# Patient Record
Sex: Female | Born: 1945 | Race: White | Hispanic: No | Marital: Married | Smoking: Never smoker
Health system: Southern US, Community
[De-identification: ages and names within clinical notes are randomized; demographics above are authoritative.]

## PROBLEM LIST (undated history)

## (undated) DIAGNOSIS — R002 Palpitations: Secondary | ICD-10-CM

## (undated) DIAGNOSIS — I82409 Acute embolism and thrombosis of unspecified deep veins of unspecified lower extremity: Secondary | ICD-10-CM

## (undated) DIAGNOSIS — I10 Essential (primary) hypertension: Secondary | ICD-10-CM

## (undated) DIAGNOSIS — D66 Hereditary factor VIII deficiency: Secondary | ICD-10-CM

## (undated) HISTORY — DX: Acute embolism and thrombosis of unspecified deep veins of unspecified lower extremity: I82.409

## (undated) HISTORY — DX: Hereditary factor VIII deficiency: D66

## (undated) HISTORY — DX: Palpitations: R00.2

## (undated) HISTORY — DX: Essential (primary) hypertension: I10

---

## 1979-02-22 HISTORY — PX: GANGLION CYST EXCISION: SHX1691

## 1998-08-09 ENCOUNTER — Other Ambulatory Visit: Admission: RE | Admit: 1998-08-09 | Discharge: 1998-08-09 | Payer: Self-pay | Admitting: Obstetrics and Gynecology

## 1999-12-02 ENCOUNTER — Other Ambulatory Visit: Admission: RE | Admit: 1999-12-02 | Discharge: 1999-12-02 | Payer: Self-pay | Admitting: Obstetrics and Gynecology

## 2001-02-08 ENCOUNTER — Other Ambulatory Visit: Admission: RE | Admit: 2001-02-08 | Discharge: 2001-02-08 | Payer: Self-pay | Admitting: Obstetrics and Gynecology

## 2002-11-02 ENCOUNTER — Other Ambulatory Visit: Admission: RE | Admit: 2002-11-02 | Discharge: 2002-11-02 | Payer: Self-pay | Admitting: Obstetrics and Gynecology

## 2004-07-11 ENCOUNTER — Other Ambulatory Visit: Admission: RE | Admit: 2004-07-11 | Discharge: 2004-07-11 | Payer: Self-pay | Admitting: Obstetrics and Gynecology

## 2004-07-15 ENCOUNTER — Encounter: Admission: RE | Admit: 2004-07-15 | Discharge: 2004-07-15 | Payer: Self-pay | Admitting: Obstetrics and Gynecology

## 2004-10-11 ENCOUNTER — Encounter: Admission: RE | Admit: 2004-10-11 | Discharge: 2004-10-11 | Payer: Self-pay | Admitting: Family Medicine

## 2005-04-03 ENCOUNTER — Encounter: Admission: RE | Admit: 2005-04-03 | Discharge: 2005-04-03 | Payer: Self-pay | Admitting: Obstetrics and Gynecology

## 2005-12-15 ENCOUNTER — Ambulatory Visit: Payer: Self-pay | Admitting: Internal Medicine

## 2005-12-15 ENCOUNTER — Observation Stay (HOSPITAL_COMMUNITY): Admission: EM | Admit: 2005-12-15 | Discharge: 2005-12-16 | Payer: Self-pay | Admitting: Emergency Medicine

## 2005-12-16 ENCOUNTER — Ambulatory Visit: Payer: Self-pay

## 2009-03-14 ENCOUNTER — Encounter: Admission: RE | Admit: 2009-03-14 | Discharge: 2009-03-14 | Payer: Self-pay | Admitting: Obstetrics and Gynecology

## 2010-04-09 ENCOUNTER — Encounter: Admission: RE | Admit: 2010-04-09 | Discharge: 2010-04-09 | Payer: Self-pay | Admitting: Obstetrics and Gynecology

## 2010-07-25 ENCOUNTER — Other Ambulatory Visit (HOSPITAL_COMMUNITY): Payer: Self-pay | Admitting: Obstetrics and Gynecology

## 2010-07-25 DIAGNOSIS — Z8041 Family history of malignant neoplasm of ovary: Secondary | ICD-10-CM

## 2010-07-25 DIAGNOSIS — N7011 Chronic salpingitis: Secondary | ICD-10-CM

## 2010-07-31 ENCOUNTER — Ambulatory Visit (HOSPITAL_COMMUNITY)
Admission: RE | Admit: 2010-07-31 | Discharge: 2010-07-31 | Disposition: A | Discharge status: Home / Self Care | Payer: 59 | Source: Ambulatory Visit | Attending: Obstetrics and Gynecology | Admitting: Obstetrics and Gynecology

## 2010-07-31 DIAGNOSIS — N7011 Chronic salpingitis: Secondary | ICD-10-CM

## 2010-07-31 DIAGNOSIS — N7013 Chronic salpingitis and oophoritis: Secondary | ICD-10-CM | POA: Insufficient documentation

## 2010-07-31 DIAGNOSIS — Z8041 Family history of malignant neoplasm of ovary: Secondary | ICD-10-CM | POA: Insufficient documentation

## 2010-07-31 MED ORDER — IOHEXOL 300 MG/ML  SOLN: 100.0000 mL | Freq: Once | INTRAMUSCULAR | Status: DC | PRN

## 2010-07-31 MED ORDER — IOHEXOL 300 MG/ML  SOLN: 100.0000 mL | Freq: Once | INTRAMUSCULAR | Status: AC | PRN

## 2010-11-08 NOTE — H&P (Signed)
NAMESHAMAINE, MULKERN NO.:  [id number]   MEDICAL RECORD NO.:  [id number]          PATIENT TYPE:  OBV   LOCATION:  1845                         FACILITY:  [facility]   PHYSICIAN:  Pricilla Riffle, M.D.    DATE OF BIRTH:  1946-02-02   DATE OF ADMISSION:  12/15/2005  DATE OF DISCHARGE:                                HISTORY & PHYSICAL   IDENTIFICATION:  I was asked to see the patient regarding chest pain.   HISTORY OF PRESENT ILLNESS:  The patient has no known history of coronary  artery disease.  She was in her usual state of health until Friday, when she  had left arm pain.  She said she had been doing some exercises more with her  left arm recently.  Friday night, she had trouble sleeping.  Saturday she  developed some burning chest pressure that radiated to her belly, associated  with some weakness, dizziness, diaphoresis.  It lasted about 30 minutes.  It  did not occur with any exertion.  Again on Sunday, intermittent arm pain on  the left.  No more chest pain.  Had some back pain.  Now complains of upper  back pain; questions if it is the cot.  No shortness of breath.  No nausea  or vomiting.   ALLERGIES:  None.   MEDICATIONS:  Multivitamin p.r.n.   PAST MEDICAL HISTORY:  1.  Gastroesophageal reflux in the past.  2.  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  On anticoagulation for a while, before discharged from clinic.   PAST SURGICAL HISTORY:  Colon ganglionic cyst.   SOCIAL HISTORY:  The patient is married.  Has worked in Air[employer].  Quit tobacco  40 years ago.  Does not drink.  Walks some.  Exercises some.   FAMILY HISTORY:  Mother died of CHF and lung cancer at age 52.  Father is  alive.  No history of CAD.  Siblings with no CAD.  Maternal uncle with CAD.   REVIEW OF SYSTEMS:  All systems reviewed.  Had some left calf pain 3 days  ago; otherwise, negative for other problems, except as noted.   PHYSICAL EXAMINATION:   GENERAL:  The patient is currently in no acute  distress.  The patient denies chest pain.  VITAL SIGNS:  Blood pressure 135/78, pulse 103 and 66, respiratory rate 21.  HEENT:  Normocephalic and atraumatic.  Pupils equal, round and reactive to  light.  Extraocular movements intact.  NECK:  JVP is normal.  No bruits.  LUNGS:  Clear to auscultation.  BACK:  Nontender.  CHEST:  Nontender.  ARMS:  No pain with movement.  CARDIAC:  Regular rate and rhythm, S1 and S2.  No S3, S4 or murmurs.  ABDOMEN:  Benign.  EXTREMITIES:  Good distal pulses.  No lower extremity edema.  NEUROLOGIC:  Alert and oriented x3.  Cranial nerves II-XII intact.  There  was 5/5 motor throughout.   CHEST X-RAY:  No acute disease.  A 12-lead electrocardiogram revealed normal  sinus rhythm at 66 beats per minute.   LABORATORY DATA:  Significant for a hemoglobin of 14.6, WBC of 3.9.  BUN and  creatinine of 11 and 0.9.  Potassium 4.7.  D-dimer negative at 0.32.   IMPRESSION:  The patient is a 65 year old woman with no known coronary  artery disease who had an episode of chest pain on Saturday, arm pain on  Friday, some Saturday and Sunday.  Notes some exercise with her arm.  The  pain is not completely typical in her chest, not exertional.  Note blood  pressure was higher today in Dr. Harlene Salts office; on arrival here 170s  systolic over 80s to 90s.  Currently pain free, except for some pain across  the back.   PLAN:  Admit, rule out myocardial infarction, check a CMET, load her with  beta blocker, aspirin, amylase, lipase.  Referral for stress Myoview.   History is unclear regarding her arterial clot, but D-dimer is negative.  Check fasting lipids.           ______________________________  Pricilla Riffle, M.D.     PVR/MEDQ  D:  12/15/2005  T:  12/15/2005  Job:  [id number]

## 2010-11-08 NOTE — Discharge Summary (Signed)
[name], [name]:  [id number]   MEDICAL RECORD NO.:  [id number]          PATIENT TYPE:  OBV   LOCATION:  6526                         FACILITY:  [facility]   PHYSICIAN:  Charlton Haws, M.D.     DATE OF BIRTH:  10/04/1945   DATE OF ADMISSION:  12/15/2005  DATE OF DISCHARGE:  12/16/2005                                 DISCHARGE SUMMARY   PRIMARY CARE PHYSICIAN:  Lianne Bushy, M.D.   PRIMARY CARDIOLOGIST:  The patient is new to [facility] cardiology and has seen  Dr. Dietrich Pates.   PRINCIPAL DIAGNOSIS:  Chest pain.   OTHER DIAGNOSES:  1.  Gastroesophageal reflux disease.  2.  History of left lower extremity clot in 1969.   ALLERGIES:  No known drug allergies.   PROCEDURES:  None.   HISTORY OF PRESENT ILLNESS:  A 65 year old white female with no prior  history of CAD, who had sudden onset of left arm pain on Friday, June 22,  and then on June 23 had substernal chest burning with radiation to the left  arm associated weakness, presyncope and diaphoresis, lasting approximately  30 minutes and was worse with exertion.  She continued have intermittent  chest pain on June 24, thus prompting her to present to [facility] ED on  June 25 with complaints of upper back pain.  Given prolonged history of  chest discomfort, she was admitted for further evaluation.   HOSPITAL COURSE:  She ruled out for MI and has not had any additional chest  discomfort.  She has been scheduled for an outpatient Myoview study at  [facility] today at 12:15 p.m.  She is being discharged home in the  hospital today in satisfactory condition.   DISCHARGE LABS:  Hemoglobin 12.7, hematocrit  37.3, WBC 4.7, platelets 233.  Sodium 140, potassium 3.8, chloride 104, CO2 28, BUN 8, creatinine 0.7,  glucose 100.  PT 14.4, INR 1.1.  Total bilirubin 0.7, alkaline phosphatase  46, AST 17, ALT 13, amylase 41, lipase 28, albumin 4.1.  Cardiac markers  negative x2.  Total cholesterol 223,  triglycerides 36, HDL 86, LDL 130.  Calcium 9.1.  D-dimer 0.32.   DISPOSITION:  The patient is being discharged home today in good condition.   FOLLOW-UP APPOINTMENTS:  She is asked to followup with her primary care  physician, Dr. Purnell Shoemaker, in 1-2 weeks.  She will have an outpatient functional  study today and provided that this is negative, she does not require any  additional cardiac evaluation.  If, however, outpatient study was abnormal,  she will be contacted by our office for additional cardiology follow-up.   DISCHARGE MEDICATIONS:  Aspirin 81 mg q.d.   OUTSTANDING LAB STUDIES:  None.   DURATION OF DISCHARGE ENCOUNTER:  35 minutes including physician.      Ok Anis, NP    ______________________________  Charlton Haws, M.D.    CRB/MEDQ  D:  12/16/2005  T:  12/16/2005  Job:  [id number]   cc:   Lianne Bushy, M.D.  Fax: [phone]

## 2012-04-01 DIAGNOSIS — R42 Dizziness and giddiness: Secondary | ICD-10-CM | POA: Insufficient documentation

## 2012-04-01 DIAGNOSIS — M542 Cervicalgia: Secondary | ICD-10-CM | POA: Insufficient documentation

## 2012-04-01 DIAGNOSIS — H538 Other visual disturbances: Secondary | ICD-10-CM | POA: Insufficient documentation

## 2012-08-12 DIAGNOSIS — M7918 Myalgia, other site: Secondary | ICD-10-CM | POA: Insufficient documentation

## 2014-07-25 DIAGNOSIS — E039 Hypothyroidism, unspecified: Secondary | ICD-10-CM | POA: Diagnosis not present

## 2014-07-25 DIAGNOSIS — E559 Vitamin D deficiency, unspecified: Secondary | ICD-10-CM | POA: Diagnosis not present

## 2014-07-25 DIAGNOSIS — E639 Nutritional deficiency, unspecified: Secondary | ICD-10-CM | POA: Diagnosis not present

## 2014-07-27 DIAGNOSIS — D2239 Melanocytic nevi of other parts of face: Secondary | ICD-10-CM | POA: Diagnosis not present

## 2014-07-27 DIAGNOSIS — L814 Other melanin hyperpigmentation: Secondary | ICD-10-CM | POA: Diagnosis not present

## 2014-08-01 DIAGNOSIS — Z1231 Encounter for screening mammogram for malignant neoplasm of breast: Secondary | ICD-10-CM | POA: Diagnosis not present

## 2014-08-01 DIAGNOSIS — Z124 Encounter for screening for malignant neoplasm of cervix: Secondary | ICD-10-CM | POA: Diagnosis not present

## 2014-08-01 DIAGNOSIS — Z6827 Body mass index (BMI) 27.0-27.9, adult: Secondary | ICD-10-CM | POA: Diagnosis not present

## 2014-09-08 DIAGNOSIS — J209 Acute bronchitis, unspecified: Secondary | ICD-10-CM | POA: Diagnosis not present

## 2014-12-08 DIAGNOSIS — E039 Hypothyroidism, unspecified: Secondary | ICD-10-CM | POA: Diagnosis not present

## 2014-12-08 DIAGNOSIS — M549 Dorsalgia, unspecified: Secondary | ICD-10-CM | POA: Diagnosis not present

## 2014-12-08 DIAGNOSIS — E038 Other specified hypothyroidism: Secondary | ICD-10-CM | POA: Diagnosis not present

## 2014-12-08 DIAGNOSIS — I10 Essential (primary) hypertension: Secondary | ICD-10-CM | POA: Diagnosis not present

## 2014-12-08 DIAGNOSIS — M546 Pain in thoracic spine: Secondary | ICD-10-CM | POA: Diagnosis not present

## 2015-03-20 DIAGNOSIS — I10 Essential (primary) hypertension: Secondary | ICD-10-CM | POA: Diagnosis not present

## 2015-03-25 DIAGNOSIS — I10 Essential (primary) hypertension: Secondary | ICD-10-CM | POA: Diagnosis not present

## 2015-04-03 DIAGNOSIS — R42 Dizziness and giddiness: Secondary | ICD-10-CM | POA: Diagnosis not present

## 2015-04-03 DIAGNOSIS — I10 Essential (primary) hypertension: Secondary | ICD-10-CM | POA: Diagnosis not present

## 2015-04-03 DIAGNOSIS — E663 Overweight: Secondary | ICD-10-CM | POA: Diagnosis not present

## 2015-04-03 DIAGNOSIS — Z79899 Other long term (current) drug therapy: Secondary | ICD-10-CM | POA: Diagnosis not present

## 2015-04-03 DIAGNOSIS — E039 Hypothyroidism, unspecified: Secondary | ICD-10-CM | POA: Diagnosis not present

## 2015-05-01 DIAGNOSIS — M542 Cervicalgia: Secondary | ICD-10-CM | POA: Diagnosis not present

## 2015-05-01 DIAGNOSIS — H81392 Other peripheral vertigo, left ear: Secondary | ICD-10-CM | POA: Diagnosis not present

## 2015-05-01 DIAGNOSIS — M799 Soft tissue disorder, unspecified: Secondary | ICD-10-CM | POA: Diagnosis not present

## 2015-05-01 DIAGNOSIS — R42 Dizziness and giddiness: Secondary | ICD-10-CM | POA: Diagnosis not present

## 2015-05-02 DIAGNOSIS — M542 Cervicalgia: Secondary | ICD-10-CM | POA: Diagnosis not present

## 2015-05-02 DIAGNOSIS — H81392 Other peripheral vertigo, left ear: Secondary | ICD-10-CM | POA: Diagnosis not present

## 2015-05-02 DIAGNOSIS — R42 Dizziness and giddiness: Secondary | ICD-10-CM | POA: Diagnosis not present

## 2015-05-02 DIAGNOSIS — M799 Soft tissue disorder, unspecified: Secondary | ICD-10-CM | POA: Diagnosis not present

## 2015-05-03 DIAGNOSIS — Z9181 History of falling: Secondary | ICD-10-CM | POA: Diagnosis not present

## 2015-05-03 DIAGNOSIS — I10 Essential (primary) hypertension: Secondary | ICD-10-CM | POA: Diagnosis not present

## 2015-05-08 DIAGNOSIS — R42 Dizziness and giddiness: Secondary | ICD-10-CM | POA: Diagnosis not present

## 2015-05-08 DIAGNOSIS — M542 Cervicalgia: Secondary | ICD-10-CM | POA: Diagnosis not present

## 2015-05-08 DIAGNOSIS — M799 Soft tissue disorder, unspecified: Secondary | ICD-10-CM | POA: Diagnosis not present

## 2015-05-08 DIAGNOSIS — H81392 Other peripheral vertigo, left ear: Secondary | ICD-10-CM | POA: Diagnosis not present

## 2015-05-09 DIAGNOSIS — H81392 Other peripheral vertigo, left ear: Secondary | ICD-10-CM | POA: Diagnosis not present

## 2015-05-09 DIAGNOSIS — R42 Dizziness and giddiness: Secondary | ICD-10-CM | POA: Diagnosis not present

## 2015-05-09 DIAGNOSIS — M542 Cervicalgia: Secondary | ICD-10-CM | POA: Diagnosis not present

## 2015-05-09 DIAGNOSIS — M799 Soft tissue disorder, unspecified: Secondary | ICD-10-CM | POA: Diagnosis not present

## 2015-05-10 DIAGNOSIS — H81392 Other peripheral vertigo, left ear: Secondary | ICD-10-CM | POA: Diagnosis not present

## 2015-05-10 DIAGNOSIS — R42 Dizziness and giddiness: Secondary | ICD-10-CM | POA: Diagnosis not present

## 2015-05-10 DIAGNOSIS — M799 Soft tissue disorder, unspecified: Secondary | ICD-10-CM | POA: Diagnosis not present

## 2015-05-10 DIAGNOSIS — M542 Cervicalgia: Secondary | ICD-10-CM | POA: Diagnosis not present

## 2015-05-13 DIAGNOSIS — H81392 Other peripheral vertigo, left ear: Secondary | ICD-10-CM | POA: Diagnosis not present

## 2015-05-13 DIAGNOSIS — M799 Soft tissue disorder, unspecified: Secondary | ICD-10-CM | POA: Diagnosis not present

## 2015-05-13 DIAGNOSIS — R42 Dizziness and giddiness: Secondary | ICD-10-CM | POA: Diagnosis not present

## 2015-05-13 DIAGNOSIS — M542 Cervicalgia: Secondary | ICD-10-CM | POA: Diagnosis not present

## 2015-05-22 DIAGNOSIS — M799 Soft tissue disorder, unspecified: Secondary | ICD-10-CM | POA: Diagnosis not present

## 2015-05-22 DIAGNOSIS — M542 Cervicalgia: Secondary | ICD-10-CM | POA: Diagnosis not present

## 2015-05-22 DIAGNOSIS — H81392 Other peripheral vertigo, left ear: Secondary | ICD-10-CM | POA: Diagnosis not present

## 2015-05-22 DIAGNOSIS — R42 Dizziness and giddiness: Secondary | ICD-10-CM | POA: Diagnosis not present

## 2015-05-23 DIAGNOSIS — R42 Dizziness and giddiness: Secondary | ICD-10-CM | POA: Diagnosis not present

## 2015-05-23 DIAGNOSIS — M542 Cervicalgia: Secondary | ICD-10-CM | POA: Diagnosis not present

## 2015-05-23 DIAGNOSIS — H81392 Other peripheral vertigo, left ear: Secondary | ICD-10-CM | POA: Diagnosis not present

## 2015-05-23 DIAGNOSIS — M799 Soft tissue disorder, unspecified: Secondary | ICD-10-CM | POA: Diagnosis not present

## 2015-05-24 DIAGNOSIS — M542 Cervicalgia: Secondary | ICD-10-CM | POA: Diagnosis not present

## 2015-05-24 DIAGNOSIS — M799 Soft tissue disorder, unspecified: Secondary | ICD-10-CM | POA: Diagnosis not present

## 2015-05-24 DIAGNOSIS — R42 Dizziness and giddiness: Secondary | ICD-10-CM | POA: Diagnosis not present

## 2015-05-24 DIAGNOSIS — H81392 Other peripheral vertigo, left ear: Secondary | ICD-10-CM | POA: Diagnosis not present

## 2015-05-28 DIAGNOSIS — M542 Cervicalgia: Secondary | ICD-10-CM | POA: Diagnosis not present

## 2015-05-28 DIAGNOSIS — R42 Dizziness and giddiness: Secondary | ICD-10-CM | POA: Diagnosis not present

## 2015-05-28 DIAGNOSIS — M799 Soft tissue disorder, unspecified: Secondary | ICD-10-CM | POA: Diagnosis not present

## 2015-05-28 DIAGNOSIS — H81392 Other peripheral vertigo, left ear: Secondary | ICD-10-CM | POA: Diagnosis not present

## 2015-05-29 ENCOUNTER — Encounter: Payer: Self-pay | Admitting: Interventional Cardiology

## 2015-05-29 ENCOUNTER — Ambulatory Visit (INDEPENDENT_AMBULATORY_CARE_PROVIDER_SITE_OTHER): Payer: Medicare Other | Admitting: Interventional Cardiology

## 2015-05-29 VITALS — BP 140/78 | HR 84 | Ht 64.0 in | Wt 153.0 lb

## 2015-05-29 DIAGNOSIS — I10 Essential (primary) hypertension: Secondary | ICD-10-CM | POA: Diagnosis not present

## 2015-05-29 DIAGNOSIS — D6859 Other primary thrombophilia: Secondary | ICD-10-CM | POA: Diagnosis not present

## 2015-05-29 DIAGNOSIS — R011 Cardiac murmur, unspecified: Secondary | ICD-10-CM | POA: Diagnosis not present

## 2015-05-29 DIAGNOSIS — M542 Cervicalgia: Secondary | ICD-10-CM | POA: Diagnosis not present

## 2015-05-29 DIAGNOSIS — M799 Soft tissue disorder, unspecified: Secondary | ICD-10-CM | POA: Diagnosis not present

## 2015-05-29 DIAGNOSIS — R42 Dizziness and giddiness: Secondary | ICD-10-CM | POA: Diagnosis not present

## 2015-05-29 DIAGNOSIS — H81392 Other peripheral vertigo, left ear: Secondary | ICD-10-CM | POA: Diagnosis not present

## 2015-05-29 DIAGNOSIS — D6852 Prothrombin gene mutation: Secondary | ICD-10-CM

## 2015-05-29 NOTE — Progress Notes (Signed)
Patient ID: Jamie King, female   DOB: [birth year], 69 y.o.   MRN: [id number]     Cardiology Office Note   Date:  05/29/2015   ID:  Jamie King, DOB 1946-01-09, MRN [id number]  PCP:  [facility]    No chief complaint on file. murmur   Wt Readings from Last 3 Encounters:  05/29/15 153 lb (69.4 kg)       History of Present Illness: Courtland Mellin is a 69 y.o. female  Who has had some issues with DVT x 2; once after childbirth and after a broken neck.  She did not do well with anticoagulation so takes only aspirin.  Irt was recommended tohat she stay on anticoagulation.  She did not tolerate Xarelto.    She has had BP spikes, several times a year.  BPs to the [id number] systolic.  She gets dizzy and flushed.  She had some palpitations.  Since OCtober, BP has been controlled.  She is more used to her meds at this point.  She was getting some PT for a prior broken neck causing stiffness., PMD heard a murmur so she was sent here.  No CP with exertion.  SHe does do regular walking.  She does this for 30 minutes 3x/week.  She feels well with this activity.    Mother had CHF.    She was concerned about an abnormal ECG reading.   She brought labwork revealing that she is heterozygous for prothrombin gene mutation.     Past Medical History  Diagnosis Date  . Hypertension   . Palpitations   . DVT (deep venous thrombosis) ([place])   . 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 Ganglion cyst excision  1980'S     Current Outpatient Prescriptions  Medication Sig Dispense Refill  . acidophilus (RISAQUAD) CAPS capsule Take 1 capsule by mouth daily. PROBIOTIC    . aspirin 81 MG tablet Take 81 mg by mouth daily.    . Betaine HCl 300 MG TABS Take 1 tablet by mouth daily.    . Iodine Strong, Lugols, (LUGOLS PO) Take 50 mg by mouth daily.    Marland Kitchen levothyroxine (SYNTHROID, LEVOTHROID) 75 MCG tablet Take 75 mcg by mouth daily before  breakfast.    . liothyronine (CYTOMEL) 5 MCG tablet Take 5 mcg by mouth daily.    Marland Kitchen lisinopril (PRINIVIL,ZESTRIL) 20 MG tablet Take 20 mg by mouth daily.    . magnesium gluconate (MAGONATE) 30 MG tablet Take 30 mg by mouth 2 (two) times daily.    . Omega-3 Fatty Acids (FISH OIL PO) Take by mouth daily.    Marland Kitchen OVER THE COUNTER MEDICATION Take 1 capsule by mouth daily. Serretia    . OVER THE COUNTER MEDICATION Take 1 capsule by mouth daily. DioVasc    . Pantothenic Acid 250 MG CAPS Take 1 capsule by mouth daily.     No current facility-administered medications for this visit.    Allergies:   Codeine and Oxycodone    Social History:  The patient  reports that she has never smoked. She does not have any smokeless tobacco history on file. She reports that she drinks about 0.6 - 1.2 oz of alcohol per week.   Family History:  The patient's family history includes Cancer in her mother; Heart attack in her mother; Heart disease in her mother; Stroke in her father. There is no history of Hypertension.    ROS:  Please see the history of present illness.   Otherwise, review of systems are positive for rare BP spikes.   All other systems are reviewed and negative.    PHYSICAL EXAM: VS:  BP 140/78 mmHg  Pulse 84  Ht 5\' 4"  (1.626 m)  Wt 153 lb (69.4 kg)  BMI 26.25 kg/m2  SpO2 97% , BMI Body mass index is 26.25 kg/(m^2). GEN: Well nourished, well developed, in no acute distress HEENT: normal Neck: no JVD, carotid bruits, or masses Cardiac: RRR; early, soft murmurs, no rubs, or gallops,no edema  Respiratory:  clear to auscultation bilaterally, normal work of breathing GI: soft, nontender, nondistended, + BS MS: no deformity or atrophy Skin: warm and dry, no rash Neuro:  Strength and sensation are intact Psych: euthymic mood, full affect     Recent Labs: No results found for requested labs within last 365 days.   Lipid Panel No results found for: CHOL, TRIG, HDL, CHOLHDL, VLDL, LDLCALC,  LDLDIRECT   Other studies Reviewed: Additional studies/ records that were reviewed today with results demonstrating: prior ECGs showing PRWP.   ASSESSMENT AND PLAN:  1. Heart murmur: Soft, early systolic murmur. Sounds benign. She has no cardiac symptoms and an otherwise normal cardiac exam. Would not plan for any further cardiac testing. She does develop any symptoms of chest discomfort or shortness of breath, could consider echocardiogram or further treadmill testing. If she develops palpitations, could consider rhythm monitoring. Of note, she had a stress test in 2007 which apparently was negative per her report. 2. Hypertension: Fairly well controlled at this time. I did instruct her that if she has a blood pressure spike, she can take an extra 20 mg of lisinopril. 3. Hypothyroid: Her blood pressure may be affected by her thyroid function. She has had some difficulty with her thyroid medicines recently. No further cardiac evaluation needed before her endocrine evaluation. 4. Poor R wave progression noted on ECG. No clinical signs of low EF. This may be a normal variant for her as it appeared on several serial ECGs.  Would not  Pursue imaging unless she had symptoms.   Current medicines are reviewed at length with the patient today.  The patient concerns regarding her medicines were addressed.  The following changes have been made:  No change  Labs/ tests ordered today include:  No orders of the defined types were placed in this encounter.    Recommend 150 minutes/week of aerobic exercise Low fat, low carb, high fiber diet recommended  Disposition:   FU prn   Teresita Madura., MD  05/29/2015 12:11 PM    [facility] [place], [place], [place]  [address] Phone: [phone]; Fax: [phone]

## 2015-05-29 NOTE — Patient Instructions (Signed)
**Note De-identified Janyla Biscoe Obfuscation** Medication Instructions:  Same-no changes  Labwork: None  Testing/Procedures: None  Follow-Up: As needed     If you need a refill on your cardiac medications before your next appointment, please call your pharmacy.   

## 2015-05-30 DIAGNOSIS — M542 Cervicalgia: Secondary | ICD-10-CM | POA: Diagnosis not present

## 2015-05-30 DIAGNOSIS — M799 Soft tissue disorder, unspecified: Secondary | ICD-10-CM | POA: Diagnosis not present

## 2015-05-30 DIAGNOSIS — H81392 Other peripheral vertigo, left ear: Secondary | ICD-10-CM | POA: Diagnosis not present

## 2015-05-30 DIAGNOSIS — R42 Dizziness and giddiness: Secondary | ICD-10-CM | POA: Diagnosis not present

## 2015-06-05 DIAGNOSIS — H81392 Other peripheral vertigo, left ear: Secondary | ICD-10-CM | POA: Diagnosis not present

## 2015-06-05 DIAGNOSIS — R42 Dizziness and giddiness: Secondary | ICD-10-CM | POA: Diagnosis not present

## 2015-06-05 DIAGNOSIS — M799 Soft tissue disorder, unspecified: Secondary | ICD-10-CM | POA: Diagnosis not present

## 2015-06-05 DIAGNOSIS — M542 Cervicalgia: Secondary | ICD-10-CM | POA: Diagnosis not present

## 2015-06-06 DIAGNOSIS — M542 Cervicalgia: Secondary | ICD-10-CM | POA: Diagnosis not present

## 2015-06-06 DIAGNOSIS — H81392 Other peripheral vertigo, left ear: Secondary | ICD-10-CM | POA: Diagnosis not present

## 2015-06-06 DIAGNOSIS — M799 Soft tissue disorder, unspecified: Secondary | ICD-10-CM | POA: Diagnosis not present

## 2015-06-06 DIAGNOSIS — R42 Dizziness and giddiness: Secondary | ICD-10-CM | POA: Diagnosis not present

## 2015-06-11 DIAGNOSIS — M542 Cervicalgia: Secondary | ICD-10-CM | POA: Diagnosis not present

## 2015-06-11 DIAGNOSIS — M799 Soft tissue disorder, unspecified: Secondary | ICD-10-CM | POA: Diagnosis not present

## 2015-06-11 DIAGNOSIS — H81392 Other peripheral vertigo, left ear: Secondary | ICD-10-CM | POA: Diagnosis not present

## 2015-06-11 DIAGNOSIS — R42 Dizziness and giddiness: Secondary | ICD-10-CM | POA: Diagnosis not present

## 2015-06-12 DIAGNOSIS — M542 Cervicalgia: Secondary | ICD-10-CM | POA: Diagnosis not present

## 2015-06-12 DIAGNOSIS — M799 Soft tissue disorder, unspecified: Secondary | ICD-10-CM | POA: Diagnosis not present

## 2015-06-12 DIAGNOSIS — H81392 Other peripheral vertigo, left ear: Secondary | ICD-10-CM | POA: Diagnosis not present

## 2015-06-12 DIAGNOSIS — R42 Dizziness and giddiness: Secondary | ICD-10-CM | POA: Diagnosis not present

## 2015-06-19 DIAGNOSIS — H81392 Other peripheral vertigo, left ear: Secondary | ICD-10-CM | POA: Diagnosis not present

## 2015-06-19 DIAGNOSIS — M542 Cervicalgia: Secondary | ICD-10-CM | POA: Diagnosis not present

## 2015-06-19 DIAGNOSIS — R42 Dizziness and giddiness: Secondary | ICD-10-CM | POA: Diagnosis not present

## 2015-06-19 DIAGNOSIS — M799 Soft tissue disorder, unspecified: Secondary | ICD-10-CM | POA: Diagnosis not present

## 2015-06-21 DIAGNOSIS — H81392 Other peripheral vertigo, left ear: Secondary | ICD-10-CM | POA: Diagnosis not present

## 2015-06-21 DIAGNOSIS — R42 Dizziness and giddiness: Secondary | ICD-10-CM | POA: Diagnosis not present

## 2015-06-21 DIAGNOSIS — M542 Cervicalgia: Secondary | ICD-10-CM | POA: Diagnosis not present

## 2015-06-21 DIAGNOSIS — M799 Soft tissue disorder, unspecified: Secondary | ICD-10-CM | POA: Diagnosis not present

## 2015-06-26 DIAGNOSIS — M799 Soft tissue disorder, unspecified: Secondary | ICD-10-CM | POA: Diagnosis not present

## 2015-06-26 DIAGNOSIS — H81392 Other peripheral vertigo, left ear: Secondary | ICD-10-CM | POA: Diagnosis not present

## 2015-06-26 DIAGNOSIS — R42 Dizziness and giddiness: Secondary | ICD-10-CM | POA: Diagnosis not present

## 2015-06-26 DIAGNOSIS — M542 Cervicalgia: Secondary | ICD-10-CM | POA: Diagnosis not present

## 2015-06-28 DIAGNOSIS — R42 Dizziness and giddiness: Secondary | ICD-10-CM | POA: Diagnosis not present

## 2015-06-28 DIAGNOSIS — H81392 Other peripheral vertigo, left ear: Secondary | ICD-10-CM | POA: Diagnosis not present

## 2015-06-28 DIAGNOSIS — M799 Soft tissue disorder, unspecified: Secondary | ICD-10-CM | POA: Diagnosis not present

## 2015-06-28 DIAGNOSIS — M542 Cervicalgia: Secondary | ICD-10-CM | POA: Diagnosis not present

## 2015-07-04 DIAGNOSIS — R42 Dizziness and giddiness: Secondary | ICD-10-CM | POA: Diagnosis not present

## 2015-07-04 DIAGNOSIS — M542 Cervicalgia: Secondary | ICD-10-CM | POA: Diagnosis not present

## 2015-07-04 DIAGNOSIS — H81392 Other peripheral vertigo, left ear: Secondary | ICD-10-CM | POA: Diagnosis not present

## 2015-07-04 DIAGNOSIS — M799 Soft tissue disorder, unspecified: Secondary | ICD-10-CM | POA: Diagnosis not present

## 2015-07-05 DIAGNOSIS — M799 Soft tissue disorder, unspecified: Secondary | ICD-10-CM | POA: Diagnosis not present

## 2015-07-05 DIAGNOSIS — M542 Cervicalgia: Secondary | ICD-10-CM | POA: Diagnosis not present

## 2015-07-05 DIAGNOSIS — R42 Dizziness and giddiness: Secondary | ICD-10-CM | POA: Diagnosis not present

## 2015-07-05 DIAGNOSIS — H81392 Other peripheral vertigo, left ear: Secondary | ICD-10-CM | POA: Diagnosis not present

## 2015-07-10 DIAGNOSIS — R42 Dizziness and giddiness: Secondary | ICD-10-CM | POA: Diagnosis not present

## 2015-07-10 DIAGNOSIS — M799 Soft tissue disorder, unspecified: Secondary | ICD-10-CM | POA: Diagnosis not present

## 2015-07-10 DIAGNOSIS — M542 Cervicalgia: Secondary | ICD-10-CM | POA: Diagnosis not present

## 2015-07-10 DIAGNOSIS — H81392 Other peripheral vertigo, left ear: Secondary | ICD-10-CM | POA: Diagnosis not present

## 2015-07-12 DIAGNOSIS — M799 Soft tissue disorder, unspecified: Secondary | ICD-10-CM | POA: Diagnosis not present

## 2015-07-12 DIAGNOSIS — R42 Dizziness and giddiness: Secondary | ICD-10-CM | POA: Diagnosis not present

## 2015-07-12 DIAGNOSIS — H81392 Other peripheral vertigo, left ear: Secondary | ICD-10-CM | POA: Diagnosis not present

## 2015-07-12 DIAGNOSIS — M542 Cervicalgia: Secondary | ICD-10-CM | POA: Diagnosis not present

## 2015-07-17 DIAGNOSIS — M799 Soft tissue disorder, unspecified: Secondary | ICD-10-CM | POA: Diagnosis not present

## 2015-07-17 DIAGNOSIS — M542 Cervicalgia: Secondary | ICD-10-CM | POA: Diagnosis not present

## 2015-07-17 DIAGNOSIS — H81392 Other peripheral vertigo, left ear: Secondary | ICD-10-CM | POA: Diagnosis not present

## 2015-07-17 DIAGNOSIS — R42 Dizziness and giddiness: Secondary | ICD-10-CM | POA: Diagnosis not present

## 2015-07-19 DIAGNOSIS — M799 Soft tissue disorder, unspecified: Secondary | ICD-10-CM | POA: Diagnosis not present

## 2015-07-19 DIAGNOSIS — R42 Dizziness and giddiness: Secondary | ICD-10-CM | POA: Diagnosis not present

## 2015-07-19 DIAGNOSIS — M542 Cervicalgia: Secondary | ICD-10-CM | POA: Diagnosis not present

## 2015-07-19 DIAGNOSIS — H81392 Other peripheral vertigo, left ear: Secondary | ICD-10-CM | POA: Diagnosis not present

## 2015-07-24 DIAGNOSIS — M542 Cervicalgia: Secondary | ICD-10-CM | POA: Diagnosis not present

## 2015-07-24 DIAGNOSIS — M799 Soft tissue disorder, unspecified: Secondary | ICD-10-CM | POA: Diagnosis not present

## 2015-07-24 DIAGNOSIS — R42 Dizziness and giddiness: Secondary | ICD-10-CM | POA: Diagnosis not present

## 2015-07-24 DIAGNOSIS — H81392 Other peripheral vertigo, left ear: Secondary | ICD-10-CM | POA: Diagnosis not present

## 2015-07-26 DIAGNOSIS — M799 Soft tissue disorder, unspecified: Secondary | ICD-10-CM | POA: Diagnosis not present

## 2015-07-26 DIAGNOSIS — M542 Cervicalgia: Secondary | ICD-10-CM | POA: Diagnosis not present

## 2015-07-26 DIAGNOSIS — R42 Dizziness and giddiness: Secondary | ICD-10-CM | POA: Diagnosis not present

## 2015-07-26 DIAGNOSIS — H81392 Other peripheral vertigo, left ear: Secondary | ICD-10-CM | POA: Diagnosis not present

## 2015-07-31 DIAGNOSIS — L821 Other seborrheic keratosis: Secondary | ICD-10-CM | POA: Diagnosis not present

## 2015-07-31 DIAGNOSIS — D225 Melanocytic nevi of trunk: Secondary | ICD-10-CM | POA: Diagnosis not present

## 2015-08-02 DIAGNOSIS — M542 Cervicalgia: Secondary | ICD-10-CM | POA: Diagnosis not present

## 2015-08-02 DIAGNOSIS — E039 Hypothyroidism, unspecified: Secondary | ICD-10-CM | POA: Diagnosis not present

## 2015-08-02 DIAGNOSIS — H81392 Other peripheral vertigo, left ear: Secondary | ICD-10-CM | POA: Diagnosis not present

## 2015-08-02 DIAGNOSIS — M799 Soft tissue disorder, unspecified: Secondary | ICD-10-CM | POA: Diagnosis not present

## 2015-08-02 DIAGNOSIS — R42 Dizziness and giddiness: Secondary | ICD-10-CM | POA: Diagnosis not present

## 2015-08-02 DIAGNOSIS — I10 Essential (primary) hypertension: Secondary | ICD-10-CM | POA: Diagnosis not present

## 2015-08-03 DIAGNOSIS — R42 Dizziness and giddiness: Secondary | ICD-10-CM | POA: Diagnosis not present

## 2015-08-03 DIAGNOSIS — H81392 Other peripheral vertigo, left ear: Secondary | ICD-10-CM | POA: Diagnosis not present

## 2015-08-03 DIAGNOSIS — M799 Soft tissue disorder, unspecified: Secondary | ICD-10-CM | POA: Diagnosis not present

## 2015-08-03 DIAGNOSIS — M542 Cervicalgia: Secondary | ICD-10-CM | POA: Diagnosis not present

## 2015-08-16 DIAGNOSIS — M8588 Other specified disorders of bone density and structure, other site: Secondary | ICD-10-CM | POA: Diagnosis not present

## 2015-08-16 DIAGNOSIS — Z1231 Encounter for screening mammogram for malignant neoplasm of breast: Secondary | ICD-10-CM | POA: Diagnosis not present

## 2015-08-16 DIAGNOSIS — N958 Other specified menopausal and perimenopausal disorders: Secondary | ICD-10-CM | POA: Diagnosis not present

## 2015-09-04 DIAGNOSIS — E039 Hypothyroidism, unspecified: Secondary | ICD-10-CM | POA: Diagnosis not present

## 2015-09-11 DIAGNOSIS — R739 Hyperglycemia, unspecified: Secondary | ICD-10-CM | POA: Diagnosis not present

## 2015-09-11 DIAGNOSIS — E509 Vitamin A deficiency, unspecified: Secondary | ICD-10-CM | POA: Diagnosis not present

## 2015-09-11 DIAGNOSIS — E279 Disorder of adrenal gland, unspecified: Secondary | ICD-10-CM | POA: Diagnosis not present

## 2015-09-11 DIAGNOSIS — E612 Magnesium deficiency: Secondary | ICD-10-CM | POA: Diagnosis not present

## 2015-09-11 DIAGNOSIS — E559 Vitamin D deficiency, unspecified: Secondary | ICD-10-CM | POA: Diagnosis not present

## 2015-09-27 DIAGNOSIS — M799 Soft tissue disorder, unspecified: Secondary | ICD-10-CM | POA: Diagnosis not present

## 2015-09-27 DIAGNOSIS — I10 Essential (primary) hypertension: Secondary | ICD-10-CM | POA: Diagnosis not present

## 2015-09-27 DIAGNOSIS — S12041S Nondisplaced lateral mass fracture of first cervical vertebra, sequela: Secondary | ICD-10-CM | POA: Diagnosis not present

## 2015-09-27 DIAGNOSIS — M542 Cervicalgia: Secondary | ICD-10-CM | POA: Diagnosis not present

## 2015-09-27 DIAGNOSIS — R293 Abnormal posture: Secondary | ICD-10-CM | POA: Diagnosis not present

## 2015-10-05 DIAGNOSIS — H1033 Unspecified acute conjunctivitis, bilateral: Secondary | ICD-10-CM | POA: Diagnosis not present

## 2015-10-10 DIAGNOSIS — M799 Soft tissue disorder, unspecified: Secondary | ICD-10-CM | POA: Diagnosis not present

## 2015-10-10 DIAGNOSIS — R293 Abnormal posture: Secondary | ICD-10-CM | POA: Diagnosis not present

## 2015-10-10 DIAGNOSIS — M542 Cervicalgia: Secondary | ICD-10-CM | POA: Diagnosis not present

## 2015-10-10 DIAGNOSIS — S12041S Nondisplaced lateral mass fracture of first cervical vertebra, sequela: Secondary | ICD-10-CM | POA: Diagnosis not present

## 2015-10-12 DIAGNOSIS — S12041S Nondisplaced lateral mass fracture of first cervical vertebra, sequela: Secondary | ICD-10-CM | POA: Diagnosis not present

## 2015-10-12 DIAGNOSIS — M799 Soft tissue disorder, unspecified: Secondary | ICD-10-CM | POA: Diagnosis not present

## 2015-10-12 DIAGNOSIS — M542 Cervicalgia: Secondary | ICD-10-CM | POA: Diagnosis not present

## 2015-10-12 DIAGNOSIS — R293 Abnormal posture: Secondary | ICD-10-CM | POA: Diagnosis not present

## 2015-10-22 DIAGNOSIS — S12041S Nondisplaced lateral mass fracture of first cervical vertebra, sequela: Secondary | ICD-10-CM | POA: Diagnosis not present

## 2015-10-22 DIAGNOSIS — M799 Soft tissue disorder, unspecified: Secondary | ICD-10-CM | POA: Diagnosis not present

## 2015-10-22 DIAGNOSIS — R293 Abnormal posture: Secondary | ICD-10-CM | POA: Diagnosis not present

## 2015-10-22 DIAGNOSIS — M542 Cervicalgia: Secondary | ICD-10-CM | POA: Diagnosis not present

## 2015-10-25 DIAGNOSIS — M799 Soft tissue disorder, unspecified: Secondary | ICD-10-CM | POA: Diagnosis not present

## 2015-10-25 DIAGNOSIS — M542 Cervicalgia: Secondary | ICD-10-CM | POA: Diagnosis not present

## 2015-10-25 DIAGNOSIS — R293 Abnormal posture: Secondary | ICD-10-CM | POA: Diagnosis not present

## 2015-10-25 DIAGNOSIS — S12041S Nondisplaced lateral mass fracture of first cervical vertebra, sequela: Secondary | ICD-10-CM | POA: Diagnosis not present

## 2015-10-31 DIAGNOSIS — M542 Cervicalgia: Secondary | ICD-10-CM | POA: Diagnosis not present

## 2015-10-31 DIAGNOSIS — M799 Soft tissue disorder, unspecified: Secondary | ICD-10-CM | POA: Diagnosis not present

## 2015-10-31 DIAGNOSIS — S12041S Nondisplaced lateral mass fracture of first cervical vertebra, sequela: Secondary | ICD-10-CM | POA: Diagnosis not present

## 2015-10-31 DIAGNOSIS — R293 Abnormal posture: Secondary | ICD-10-CM | POA: Diagnosis not present

## 2015-11-02 DIAGNOSIS — M799 Soft tissue disorder, unspecified: Secondary | ICD-10-CM | POA: Diagnosis not present

## 2015-11-02 DIAGNOSIS — S12041S Nondisplaced lateral mass fracture of first cervical vertebra, sequela: Secondary | ICD-10-CM | POA: Diagnosis not present

## 2015-11-02 DIAGNOSIS — M542 Cervicalgia: Secondary | ICD-10-CM | POA: Diagnosis not present

## 2015-11-02 DIAGNOSIS — R293 Abnormal posture: Secondary | ICD-10-CM | POA: Diagnosis not present

## 2016-01-01 DIAGNOSIS — I10 Essential (primary) hypertension: Secondary | ICD-10-CM | POA: Diagnosis not present

## 2016-07-03 DIAGNOSIS — E039 Hypothyroidism, unspecified: Secondary | ICD-10-CM | POA: Diagnosis not present

## 2016-07-03 DIAGNOSIS — I10 Essential (primary) hypertension: Secondary | ICD-10-CM | POA: Diagnosis not present

## 2016-07-31 DIAGNOSIS — D1801 Hemangioma of skin and subcutaneous tissue: Secondary | ICD-10-CM | POA: Diagnosis not present

## 2016-07-31 DIAGNOSIS — L814 Other melanin hyperpigmentation: Secondary | ICD-10-CM | POA: Diagnosis not present

## 2016-07-31 DIAGNOSIS — L821 Other seborrheic keratosis: Secondary | ICD-10-CM | POA: Diagnosis not present

## 2016-09-10 DIAGNOSIS — E039 Hypothyroidism, unspecified: Secondary | ICD-10-CM | POA: Diagnosis not present

## 2016-09-10 DIAGNOSIS — I10 Essential (primary) hypertension: Secondary | ICD-10-CM | POA: Diagnosis not present

## 2016-09-10 DIAGNOSIS — Z79899 Other long term (current) drug therapy: Secondary | ICD-10-CM | POA: Diagnosis not present

## 2016-09-10 DIAGNOSIS — R739 Hyperglycemia, unspecified: Secondary | ICD-10-CM | POA: Diagnosis not present

## 2016-09-10 DIAGNOSIS — Z0000 Encounter for general adult medical examination without abnormal findings: Secondary | ICD-10-CM | POA: Diagnosis not present

## 2016-09-10 DIAGNOSIS — E663 Overweight: Secondary | ICD-10-CM | POA: Diagnosis not present

## 2016-10-10 DIAGNOSIS — Z1231 Encounter for screening mammogram for malignant neoplasm of breast: Secondary | ICD-10-CM | POA: Diagnosis not present

## 2016-10-15 DIAGNOSIS — Z124 Encounter for screening for malignant neoplasm of cervix: Secondary | ICD-10-CM | POA: Diagnosis not present

## 2016-10-15 DIAGNOSIS — Z6828 Body mass index (BMI) 28.0-28.9, adult: Secondary | ICD-10-CM | POA: Diagnosis not present

## 2017-01-01 DIAGNOSIS — E039 Hypothyroidism, unspecified: Secondary | ICD-10-CM | POA: Diagnosis not present

## 2017-01-01 DIAGNOSIS — I10 Essential (primary) hypertension: Secondary | ICD-10-CM | POA: Diagnosis not present

## 2017-02-05 DIAGNOSIS — L814 Other melanin hyperpigmentation: Secondary | ICD-10-CM | POA: Diagnosis not present

## 2017-02-05 DIAGNOSIS — C44519 Basal cell carcinoma of skin of other part of trunk: Secondary | ICD-10-CM | POA: Diagnosis not present

## 2017-02-05 DIAGNOSIS — L820 Inflamed seborrheic keratosis: Secondary | ICD-10-CM | POA: Diagnosis not present

## 2017-03-19 DIAGNOSIS — C44529 Squamous cell carcinoma of skin of other part of trunk: Secondary | ICD-10-CM | POA: Diagnosis not present

## 2017-04-14 DIAGNOSIS — C44529 Squamous cell carcinoma of skin of other part of trunk: Secondary | ICD-10-CM | POA: Diagnosis not present

## 2017-05-07 DIAGNOSIS — C44529 Squamous cell carcinoma of skin of other part of trunk: Secondary | ICD-10-CM | POA: Diagnosis not present

## 2017-06-17 DIAGNOSIS — M549 Dorsalgia, unspecified: Secondary | ICD-10-CM | POA: Diagnosis not present

## 2017-06-17 DIAGNOSIS — I10 Essential (primary) hypertension: Secondary | ICD-10-CM | POA: Diagnosis not present

## 2017-06-17 DIAGNOSIS — Z7982 Long term (current) use of aspirin: Secondary | ICD-10-CM | POA: Diagnosis not present

## 2017-06-17 DIAGNOSIS — R0781 Pleurodynia: Secondary | ICD-10-CM | POA: Diagnosis not present

## 2017-06-17 DIAGNOSIS — R42 Dizziness and giddiness: Secondary | ICD-10-CM | POA: Diagnosis not present

## 2017-06-17 DIAGNOSIS — R079 Chest pain, unspecified: Secondary | ICD-10-CM | POA: Diagnosis not present

## 2017-06-17 DIAGNOSIS — Z79899 Other long term (current) drug therapy: Secondary | ICD-10-CM | POA: Diagnosis not present

## 2017-06-17 DIAGNOSIS — I440 Atrioventricular block, first degree: Secondary | ICD-10-CM | POA: Diagnosis not present

## 2017-06-17 DIAGNOSIS — R51 Headache: Secondary | ICD-10-CM | POA: Diagnosis not present

## 2017-06-19 DIAGNOSIS — I10 Essential (primary) hypertension: Secondary | ICD-10-CM | POA: Diagnosis not present

## 2017-07-07 DIAGNOSIS — E039 Hypothyroidism, unspecified: Secondary | ICD-10-CM | POA: Diagnosis not present

## 2017-07-07 DIAGNOSIS — I10 Essential (primary) hypertension: Secondary | ICD-10-CM | POA: Diagnosis not present

## 2017-10-07 DIAGNOSIS — J9859 Other diseases of mediastinum, not elsewhere classified: Secondary | ICD-10-CM | POA: Diagnosis not present

## 2017-10-07 DIAGNOSIS — E663 Overweight: Secondary | ICD-10-CM | POA: Diagnosis not present

## 2017-10-07 DIAGNOSIS — I10 Essential (primary) hypertension: Secondary | ICD-10-CM | POA: Diagnosis not present

## 2017-10-07 DIAGNOSIS — Z0000 Encounter for general adult medical examination without abnormal findings: Secondary | ICD-10-CM | POA: Diagnosis not present

## 2017-10-07 DIAGNOSIS — Z6826 Body mass index (BMI) 26.0-26.9, adult: Secondary | ICD-10-CM | POA: Diagnosis not present

## 2017-10-07 DIAGNOSIS — E039 Hypothyroidism, unspecified: Secondary | ICD-10-CM | POA: Diagnosis not present

## 2017-10-27 ENCOUNTER — Encounter: Payer: Medicare Other | Admitting: Cardiothoracic Surgery

## 2017-10-29 ENCOUNTER — Other Ambulatory Visit: Payer: Self-pay

## 2017-10-29 ENCOUNTER — Institutional Professional Consult (permissible substitution): Payer: PPO | Admitting: Cardiothoracic Surgery

## 2017-10-29 ENCOUNTER — Other Ambulatory Visit: Payer: Self-pay | Admitting: *Deleted

## 2017-10-29 VITALS — HR 92 | Resp 18 | Ht 64.0 in | Wt 153.0 lb

## 2017-10-29 DIAGNOSIS — J9859 Other diseases of mediastinum, not elsewhere classified: Secondary | ICD-10-CM | POA: Diagnosis not present

## 2017-10-29 NOTE — Progress Notes (Signed)
PCP is [facility] [place] Referring Provider is Street, [place], *  Chief Complaint  Patient presents with  . Mediastinal Mass    Surgical eval, CTA Chest 10/14/17   Patient examined, images of recent CT scan of chest personally reviewed and displayed and discussed with patient  HPI: 72 year old nondiabetic non-smoker with recent CTA of chest for right-sided pleuritic chest pain to evaluate potential pulmonary embolus.  Patient has history of hypercoagulability with prothrombin mutation[heterozygous] and history of DVT following childbirth and cervical spine trauma.  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  Mediastinal adenopathy benign.  No pleural effusion or pulmonary parenchymal disease.  Thoracic aorta without significant calcification.  The posterior mediastinal mass has density of fat tissue.  Patient has no previous CT scans of the chest with which to compare.  No symptoms of swallowing difficulty, blood production, hemoptysis, weight loss, night sweats, or previous thoracic trauma.  Patient has significant hypertension and this is been labile in the past requiring careful monitoring and titration of antihypertensive medication.  She has not been assessed for pheochromocytoma.  2 years ago the patient had a syncopal episode after severe nausea and vomiting probably related to food poisoning and had a fracture of C1 vertebral body which was treated with neck collar and with full recovery.  She did have an episode of DVT related to that trauma.  She was treated with Xarelto but was intolerant to because of feeling weak and diffuse myalgia.  She currently takes aspirin.  Patient is never had general anesthesia or major surgery.  She has a nosebleed approximately once every 3 months always from the right side of the nose.  Patient has had  fairly significant hypothyroidism but this was diagnosed and has been well controlled for the past 2 years. Past Medical History:  Diagnosis Date  . DVT (deep venous thrombosis) ([place])   . Factor VIII (functional) deficiency ([place])   . Hypertension   . Palpitations     Past Surgical History:  Procedure Laterality Date  . GANGLION CYST EXCISION  1980'S    Family History  Problem Relation Age of Onset  . Heart disease Mother   . Heart attack Mother   . Cancer Mother        LUNG  . Stroke Father   . Hypertension Neg Hx     Social History Social History   Tobacco Use  . Smoking status: Never Smoker  . Smokeless tobacco: Never Used  Substance Use Topics  . Alcohol use: Yes    Alcohol/week: 0.6 - 1.2 oz    Types: 1 - 2 Glasses of wine per week  . Drug use: Not on file    Current Outpatient Medications  Medication Sig Dispense Refill  . aspirin 81 MG tablet Take 81 mg by mouth daily.    Marland Kitchen b complex vitamins tablet Take 1 tablet by mouth daily.    . Betaine HCl 300 MG TABS Take 1 tablet by mouth daily.    . cholecalciferol (VITAMIN D) 400 units TABS tablet Take 400 Units by mouth.    . Cholecalciferol (VITAMIN D3 PO) Take 2 tablets by mouth.    . Iodine Strong, Lugols, (LUGOLS PO) Take 50 mg by mouth daily.    Marland Kitchen levothyroxine (SYNTHROID, LEVOTHROID) 50 MCG tablet   1  . liothyronine (CYTOMEL) 5 MCG tablet  Take 10 mcg by mouth daily.     Marland Kitchen lisinopril (PRINIVIL,ZESTRIL) 20 MG tablet Take 20 mg by mouth daily.    . Magnesium Bisglycinate (MAG GLYCINATE) 100 MG TABS Take 400 mg by mouth.    . Omega-3 Fatty Acids (FISH OIL PO) Take by mouth daily.    . Pantothenic Acid 250 MG CAPS Take 1 capsule by mouth daily.    . Turmeric 500 MG CAPS Take 1 tablet by mouth.     No current facility-administered medications for this visit.     Allergies  Allergen Reactions  . Codeine Nausea And Vomiting and Other (See Comments)  . Oxycodone Other (See Comments)    Review of Systems   Weight stable No active dental complaints No change in vision or headache No shortness of breath No angina.  Patient was evaluated by Dr. Irish Lack 2 years ago felt the patient was in good health and did not recommend an echocardiogram or stress test. No history of jaundice or hepatitis or ulcer disease Positive history of spider venous varicosities of both legs and DVT of the left femoral vein No history of stroke or seizure  Pulse 92   Resp 18   Ht 5\' 4"  (1.626 m)   Wt 153 lb (69.4 kg)   SpO2 98% Comment: RA  BMI 26.26 kg/m  Physical Exam      Exam    General- alert and comfortable    Neck- no JVD, no cervical adenopathy palpable, no carotid bruit   Lungs- clear without rales, wheezes   Cor- regular rate and rhythm, no murmur , gallop   Abdomen- soft, non-tender   Extremities - warm, non-tender, minimal edema, positive spider varicosities   Neuro- oriented, appropriate, no focal weakness   Diagnostic Tests: 4 cm ovoid mass right posterior mediastinum above the diaphragm anterior to the vertebral body without  proximity to the descending aorta.  Density is consistent with fat tissue.  I have not seen a lipoma in this area previously and there is concern this could represent a liposarcoma.  Could also represent a tumor of neural origin.  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  If there any chance this could be malignant the patient will need resection via right VATS.  If there is only trace activity on PET scan a ultrasound-guided endoscopic biopsy through the esophagus could probably aspirate some cellular material.If   the patient needs surgery she will need PFTs, brain scan to complete clinical staging, and assessment of possible pheochromocytoma with abdominal CT and metanephrine assay  Plan: Return after PET scan and review situation   Len Childs, MD [facility] [phone]

## 2017-11-11 ENCOUNTER — Ambulatory Visit (HOSPITAL_COMMUNITY)
Admission: RE | Admit: 2017-11-11 | Discharge: 2017-11-11 | Disposition: A | Discharge status: Home / Self Care | Payer: PPO | Source: Ambulatory Visit | Attending: Cardiothoracic Surgery | Admitting: Cardiothoracic Surgery

## 2017-11-11 ENCOUNTER — Encounter: Payer: PPO | Admitting: Cardiothoracic Surgery

## 2017-11-11 DIAGNOSIS — R222 Localized swelling, mass and lump, trunk: Secondary | ICD-10-CM | POA: Diagnosis not present

## 2017-11-11 DIAGNOSIS — J9859 Other diseases of mediastinum, not elsewhere classified: Secondary | ICD-10-CM | POA: Insufficient documentation

## 2017-11-11 DIAGNOSIS — N83202 Unspecified ovarian cyst, left side: Secondary | ICD-10-CM | POA: Diagnosis not present

## 2017-11-11 DIAGNOSIS — I700 Atherosclerosis of aorta: Secondary | ICD-10-CM | POA: Insufficient documentation

## 2017-11-11 DIAGNOSIS — R933 Abnormal findings on diagnostic imaging of other parts of digestive tract: Secondary | ICD-10-CM | POA: Insufficient documentation

## 2017-11-11 DIAGNOSIS — N83201 Unspecified ovarian cyst, right side: Secondary | ICD-10-CM | POA: Diagnosis not present

## 2017-11-11 DIAGNOSIS — K449 Diaphragmatic hernia without obstruction or gangrene: Secondary | ICD-10-CM | POA: Insufficient documentation

## 2017-11-11 DIAGNOSIS — K5730 Diverticulosis of large intestine without perforation or abscess without bleeding: Secondary | ICD-10-CM | POA: Diagnosis not present

## 2017-11-11 LAB — GLUCOSE, CAPILLARY: Glucose-Capillary: 102 mg/dL — ABNORMAL HIGH (ref 65–99)

## 2017-11-11 MED ORDER — FLUDEOXYGLUCOSE F - 18 (FDG) INJECTION
7.5800 | Freq: Once | INTRAVENOUS | Status: AC | PRN
  Administered 2017-11-11: 7.58 via INTRAVENOUS

## 2017-11-12 ENCOUNTER — Encounter: Payer: Self-pay | Admitting: Cardiothoracic Surgery

## 2017-11-12 ENCOUNTER — Ambulatory Visit (INDEPENDENT_AMBULATORY_CARE_PROVIDER_SITE_OTHER): Payer: PPO | Admitting: Cardiothoracic Surgery

## 2017-11-12 VITALS — BP 170/94 | HR 92 | Resp 20 | Ht 64.0 in | Wt 151.0 lb

## 2017-11-12 DIAGNOSIS — J9859 Other diseases of mediastinum, not elsewhere classified: Secondary | ICD-10-CM

## 2017-11-12 NOTE — Progress Notes (Signed)
PCP is [facility] [place] Referring Provider is Street, [place] Mt, *  Chief Complaint  Patient presents with  . Mediastinal Mass    Discuss PET Scan 11/11/17    HPI: The patient returns with PET scan for further assessment and discussion of a recently diagnosed posterior mediastinal mass with fat density.  PET scan was performed to assess malignant potential.  The CT scan-PET scan images demonstrate that the fat density is part of a hiatal hernia with abdominal fat extending through the hiatus into the lower chest.  There is no hypermetabolic activity of this region nor is there any other suspicious hypermetabolic activity.  I discussed the results of the PET scan with the patient including treating the symptoms of hiatal hernia and avoidance of eating a meal and then lying supine. There is some evidence of inflammation of her GE junction related to the probable GERD however no mass-effect or evidence of tumor. Past Medical History:  Diagnosis Date  . DVT (deep venous thrombosis) ([place])   . Factor VIII (functional) deficiency ([place])   . Hypertension   . Palpitations     Past Surgical History:  Procedure Laterality Date  . GANGLION CYST EXCISION  1980'S    Family History  Problem Relation Age of Onset  . Heart disease Mother   . Heart attack Mother   . Cancer Mother        LUNG  . Stroke Father   . Hypertension Neg Hx     Social History Social History   Tobacco Use  . Smoking status: Never Smoker  . Smokeless tobacco: Never Used  Substance Use Topics  . Alcohol use: Yes    Alcohol/week: 0.6 - 1.2 oz    Types: 1 - 2 Glasses of wine per week  . Drug use: Not on file    Current Outpatient Medications  Medication Sig Dispense Refill  . aspirin 81 MG tablet Take 81 mg by mouth daily.    Marland Kitchen b complex vitamins tablet Take 1 tablet by mouth daily.    . Betaine HCl 300 MG TABS Take 1 tablet by mouth daily.    . cholecalciferol (VITAMIN D) 400 units TABS tablet  Take 400 Units by mouth.    . Cholecalciferol (VITAMIN D3 PO) Take 2 tablets by mouth.    . Iodine Strong, Lugols, (LUGOLS PO) Take 50 mg by mouth daily.    Marland Kitchen levothyroxine (SYNTHROID, LEVOTHROID) 50 MCG tablet   1  . liothyronine (CYTOMEL) 5 MCG tablet Take 10 mcg by mouth daily.     Marland Kitchen lisinopril (PRINIVIL,ZESTRIL) 20 MG tablet Take 20 mg by mouth daily.    . Magnesium Bisglycinate (MAG GLYCINATE) 100 MG TABS Take 400 mg by mouth.    . Omega-3 Fatty Acids (FISH OIL PO) Take by mouth daily.    . Pantothenic Acid 250 MG CAPS Take 1 capsule by mouth daily.    . Turmeric 500 MG CAPS Take 1 tablet by mouth.     No current facility-administered medications for this visit.     Allergies  Allergen Reactions  . Codeine Nausea And Vomiting and Other (See Comments)  . Oxycodone Other (See Comments)    Review of Systems  No changes since previous exam for consultation  BP (!) 170/94   Pulse 92   Resp 20   Ht 5\' 4"  (1.626 m)   Wt 151 lb (68.5 kg)   SpO2 97% Comment: RA  BMI 25.92 kg/m  Physical Exam  Exam    General- alert and comfortable    Neck- no JVD, no cervical adenopathy palpable, no carotid bruit   Lungs- clear without rales, wheezes   Cor- regular rate and rhythm, no murmur , gallop   Abdomen- soft, non-tender   Extremities - warm, non-tender, minimal edema   Neuro- oriented, appropriate, no focal weakness    Diagnostic Tests: PET scan shows the mediastinal fat density to be benign abdominal fat related to a sliding hiatal hernia  Impression: No further testing or biopsy needed  Plan: Patient will follow-up with her primary physician.  She is recommended to have the primary physician view of the results of the PET scan and recommendations by radiology.  Len Childs, MD [facility] [phone]

## 2018-01-05 DIAGNOSIS — I10 Essential (primary) hypertension: Secondary | ICD-10-CM | POA: Diagnosis not present

## 2018-01-05 DIAGNOSIS — E039 Hypothyroidism, unspecified: Secondary | ICD-10-CM | POA: Diagnosis not present

## 2018-02-15 DIAGNOSIS — Z1231 Encounter for screening mammogram for malignant neoplasm of breast: Secondary | ICD-10-CM | POA: Diagnosis not present

## 2018-02-15 DIAGNOSIS — Z01419 Encounter for gynecological examination (general) (routine) without abnormal findings: Secondary | ICD-10-CM | POA: Diagnosis not present

## 2018-02-15 DIAGNOSIS — Z6825 Body mass index (BMI) 25.0-25.9, adult: Secondary | ICD-10-CM | POA: Diagnosis not present

## 2018-03-02 DIAGNOSIS — N7011 Chronic salpingitis: Secondary | ICD-10-CM | POA: Diagnosis not present

## 2018-03-02 DIAGNOSIS — Z8041 Family history of malignant neoplasm of ovary: Secondary | ICD-10-CM | POA: Diagnosis not present

## 2018-07-08 DIAGNOSIS — E039 Hypothyroidism, unspecified: Secondary | ICD-10-CM | POA: Diagnosis not present

## 2018-07-08 DIAGNOSIS — I10 Essential (primary) hypertension: Secondary | ICD-10-CM | POA: Diagnosis not present

## 2018-08-09 DIAGNOSIS — L578 Other skin changes due to chronic exposure to nonionizing radiation: Secondary | ICD-10-CM | POA: Diagnosis not present

## 2018-08-09 DIAGNOSIS — D225 Melanocytic nevi of trunk: Secondary | ICD-10-CM | POA: Diagnosis not present

## 2018-08-09 DIAGNOSIS — L821 Other seborrheic keratosis: Secondary | ICD-10-CM | POA: Diagnosis not present

## 2018-08-09 DIAGNOSIS — D485 Neoplasm of uncertain behavior of skin: Secondary | ICD-10-CM | POA: Diagnosis not present

## 2018-08-09 DIAGNOSIS — C44519 Basal cell carcinoma of skin of other part of trunk: Secondary | ICD-10-CM | POA: Diagnosis not present

## 2018-08-18 IMAGING — PT NM PET TUM IMG INITIAL (PI) SKULL BASE T - THIGH
1 of 8 series · 1 of 25 positions shown · non-contrast
Comparison: 06/17/2017 chest CT angiogram.

CLINICAL DATA: Initial treatment strategy for incidental
mediastinal mass seen on chest CT angiogram performed for right
chest pain.

EXAM:
NUCLEAR MEDICINE PET SKULL BASE TO THIGH
TECHNIQUE: 7.6 mCi F-18 FDG was injected intravenously. Full-ring PET imaging
was performed from the skull base to thigh after the radiotracer. CT
data was obtained and used for attenuation correction and anatomic
localization.
Fasting blood glucose: 102 mg/dl

[Series 5: ct sk_thigh 5.0 b31f · axial · 5.0mm · 0.91mm/px · 1 of 204 slices shown]
[im 204/204  brain]
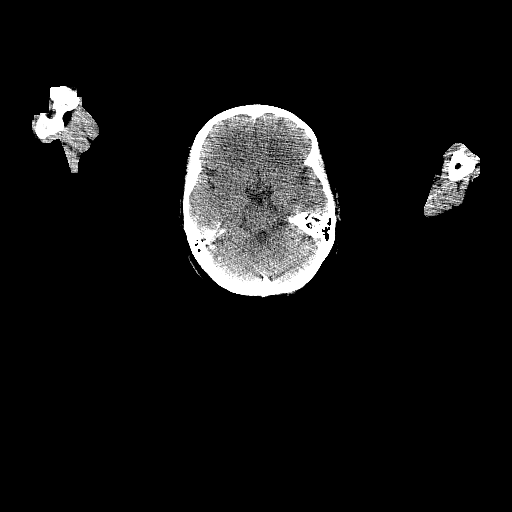

[1 of 25 positions shown; findings below may reference images not displayed]

FINDINGS: Mediastinal blood pool activity: SUV max

NECK: No hypermetabolic lymph nodes in the neck. Uniform
hypermetabolism in the normal size thyroid gland without discrete
thyroid nodules on the noncontrast CT images.

Incidental CT findings: none

CHEST:

No mediastinal mass. There is a stable small hiatal hernia with
stable adjacent herniated upper abdominal fat to the right of the
intrathoracic portion of the stomach, which accounts for the
previously described fat density finding on the 06/17/2017 chest CT
angiogram study. No hypermetabolism within this region of herniated
fat.

There is mild focal hypermetabolism within the lower thoracic
esophagus at the esophagogastric junction, without definite wall
thickening or discrete mass correlate on the CT images.

No hypermetabolic axillary, mediastinal or hilar lymph nodes. No
pulmonary hypermetabolism.

Incidental CT findings: No acute consolidative airspace disease,
lung masses or significant pulmonary nodules.

ABDOMEN/PELVIS: No abnormal hypermetabolic activity within the
liver, pancreas, adrenal glands, or spleen. No hypermetabolic lymph
nodes in the abdomen or pelvis.

Incidental CT findings: Small to moderate periampullary duodenal
diverticulum. Atherosclerotic nonaneurysmal abdominal aorta.
Haziness of the central mesenteric fat without associated mass,
hypermetabolism, adenopathy or fluid collection. Moderate diffuse
colonic diverticulosis. Minimally complex bilateral adnexal cysts
measuring 3.5 cm on the right (series 5/image 158) and 3.2 cm on the
left (series 5/image 160) with thin internal septations and no
associated hypermetabolism.

SKELETON: No focal hypermetabolic activity to suggest skeletal
metastasis.

Incidental CT findings: none
IMPRESSION: 1. No mediastinal mass. Stable small hiatal hernia with stable
adjacent herniated upper abdominal fat to the right of the
intrathoracic portion of the stomach, which accounts for the
previously described fat density finding on the 06/17/2017 chest CT
angiogram study. No hypermetabolism within this region of herniated
fat.
2. Mild nonspecific focal hypermetabolism within the lower thoracic
esophagus at the esophagogastric junction, without definite wall
thickening or discrete mass correlate on the noncontrast CT images.
Upper endoscopic correlation may be obtained as clinically
warranted.
3. Uniform hypermetabolism within the normal size thyroid gland
without discrete thyroid nodules on the noncontrast CT images.
Recommend correlation with serum thyroid function tests.
4. Misty mesentery without associated adenopathy, mass, fluid
collection or hypermetabolism. This nonspecific finding likely
represents mesenteric panniculitis. Consider attention on a
follow-up CT abdomen/pelvis with oral and IV contrast in 12 months.
5. Minimally complex bilateral adnexal cysts with thin internal
septations and no associated hypermetabolism, probably benign.
Recommend correlation with transabdominal and transvaginal pelvic
ultrasound.
6. Moderate diffuse colonic diverticulosis.
7.  Aortic Atherosclerosis ([id number]-L92.2).

## 2018-09-01 DIAGNOSIS — C44519 Basal cell carcinoma of skin of other part of trunk: Secondary | ICD-10-CM | POA: Diagnosis not present

## 2018-10-14 DIAGNOSIS — Z6827 Body mass index (BMI) 27.0-27.9, adult: Secondary | ICD-10-CM | POA: Diagnosis not present

## 2018-10-14 DIAGNOSIS — D6859 Other primary thrombophilia: Secondary | ICD-10-CM | POA: Diagnosis not present

## 2018-10-14 DIAGNOSIS — E039 Hypothyroidism, unspecified: Secondary | ICD-10-CM | POA: Diagnosis not present

## 2018-10-14 DIAGNOSIS — Z0000 Encounter for general adult medical examination without abnormal findings: Secondary | ICD-10-CM | POA: Diagnosis not present

## 2018-10-14 DIAGNOSIS — E663 Overweight: Secondary | ICD-10-CM | POA: Diagnosis not present

## 2018-10-14 DIAGNOSIS — D68318 Other hemorrhagic disorder due to intrinsic circulating anticoagulants, antibodies, or inhibitors: Secondary | ICD-10-CM | POA: Diagnosis not present

## 2018-10-14 DIAGNOSIS — K449 Diaphragmatic hernia without obstruction or gangrene: Secondary | ICD-10-CM | POA: Diagnosis not present

## 2018-10-14 DIAGNOSIS — I10 Essential (primary) hypertension: Secondary | ICD-10-CM | POA: Diagnosis not present

## 2018-12-13 DIAGNOSIS — L821 Other seborrheic keratosis: Secondary | ICD-10-CM | POA: Diagnosis not present

## 2019-01-06 DIAGNOSIS — E039 Hypothyroidism, unspecified: Secondary | ICD-10-CM | POA: Diagnosis not present

## 2019-01-06 DIAGNOSIS — I10 Essential (primary) hypertension: Secondary | ICD-10-CM | POA: Diagnosis not present

## 2019-01-19 ENCOUNTER — Other Ambulatory Visit: Payer: Self-pay

## 2019-04-13 DIAGNOSIS — R5383 Other fatigue: Secondary | ICD-10-CM | POA: Diagnosis not present

## 2019-04-13 DIAGNOSIS — Z13220 Encounter for screening for lipoid disorders: Secondary | ICD-10-CM | POA: Diagnosis not present

## 2019-04-13 DIAGNOSIS — Z6827 Body mass index (BMI) 27.0-27.9, adult: Secondary | ICD-10-CM | POA: Diagnosis not present

## 2019-04-13 DIAGNOSIS — Z79899 Other long term (current) drug therapy: Secondary | ICD-10-CM | POA: Diagnosis not present

## 2019-04-13 DIAGNOSIS — L0390 Cellulitis, unspecified: Secondary | ICD-10-CM | POA: Diagnosis not present

## 2019-04-13 DIAGNOSIS — I10 Essential (primary) hypertension: Secondary | ICD-10-CM | POA: Diagnosis not present

## 2019-04-25 DIAGNOSIS — E538 Deficiency of other specified B group vitamins: Secondary | ICD-10-CM | POA: Diagnosis not present

## 2019-05-02 DIAGNOSIS — E538 Deficiency of other specified B group vitamins: Secondary | ICD-10-CM | POA: Diagnosis not present

## 2019-05-09 DIAGNOSIS — E538 Deficiency of other specified B group vitamins: Secondary | ICD-10-CM | POA: Diagnosis not present

## 2019-05-17 DIAGNOSIS — D519 Vitamin B12 deficiency anemia, unspecified: Secondary | ICD-10-CM | POA: Diagnosis not present

## 2019-05-18 ENCOUNTER — Other Ambulatory Visit: Payer: Self-pay

## 2019-06-07 DIAGNOSIS — D519 Vitamin B12 deficiency anemia, unspecified: Secondary | ICD-10-CM | POA: Diagnosis not present

## 2019-07-08 DIAGNOSIS — D519 Vitamin B12 deficiency anemia, unspecified: Secondary | ICD-10-CM | POA: Diagnosis not present

## 2019-07-12 DIAGNOSIS — I10 Essential (primary) hypertension: Secondary | ICD-10-CM | POA: Diagnosis not present

## 2019-07-12 DIAGNOSIS — E039 Hypothyroidism, unspecified: Secondary | ICD-10-CM | POA: Diagnosis not present

## 2019-07-18 DIAGNOSIS — Z6828 Body mass index (BMI) 28.0-28.9, adult: Secondary | ICD-10-CM | POA: Diagnosis not present

## 2019-07-18 DIAGNOSIS — Z1231 Encounter for screening mammogram for malignant neoplasm of breast: Secondary | ICD-10-CM | POA: Diagnosis not present

## 2019-07-18 DIAGNOSIS — Z01419 Encounter for gynecological examination (general) (routine) without abnormal findings: Secondary | ICD-10-CM | POA: Diagnosis not present

## 2019-08-10 DIAGNOSIS — D519 Vitamin B12 deficiency anemia, unspecified: Secondary | ICD-10-CM | POA: Diagnosis not present

## 2019-08-24 DIAGNOSIS — D485 Neoplasm of uncertain behavior of skin: Secondary | ICD-10-CM | POA: Diagnosis not present

## 2019-08-24 DIAGNOSIS — L578 Other skin changes due to chronic exposure to nonionizing radiation: Secondary | ICD-10-CM | POA: Diagnosis not present

## 2019-08-24 DIAGNOSIS — D1801 Hemangioma of skin and subcutaneous tissue: Secondary | ICD-10-CM | POA: Diagnosis not present

## 2019-08-24 DIAGNOSIS — D225 Melanocytic nevi of trunk: Secondary | ICD-10-CM | POA: Diagnosis not present

## 2019-08-24 DIAGNOSIS — L821 Other seborrheic keratosis: Secondary | ICD-10-CM | POA: Diagnosis not present

## 2019-09-07 DIAGNOSIS — E538 Deficiency of other specified B group vitamins: Secondary | ICD-10-CM | POA: Diagnosis not present

## 2019-10-11 DIAGNOSIS — E538 Deficiency of other specified B group vitamins: Secondary | ICD-10-CM | POA: Diagnosis not present

## 2019-11-15 DIAGNOSIS — E538 Deficiency of other specified B group vitamins: Secondary | ICD-10-CM | POA: Diagnosis not present

## 2019-11-23 DIAGNOSIS — E538 Deficiency of other specified B group vitamins: Secondary | ICD-10-CM | POA: Diagnosis not present

## 2019-11-23 DIAGNOSIS — Z13220 Encounter for screening for lipoid disorders: Secondary | ICD-10-CM | POA: Diagnosis not present

## 2019-11-23 DIAGNOSIS — Z0000 Encounter for general adult medical examination without abnormal findings: Secondary | ICD-10-CM | POA: Diagnosis not present

## 2019-11-25 DIAGNOSIS — D68318 Other hemorrhagic disorder due to intrinsic circulating anticoagulants, antibodies, or inhibitors: Secondary | ICD-10-CM | POA: Diagnosis not present

## 2019-11-25 DIAGNOSIS — Z0000 Encounter for general adult medical examination without abnormal findings: Secondary | ICD-10-CM | POA: Diagnosis not present

## 2019-11-25 DIAGNOSIS — D6859 Other primary thrombophilia: Secondary | ICD-10-CM | POA: Diagnosis not present

## 2019-11-25 DIAGNOSIS — Z6827 Body mass index (BMI) 27.0-27.9, adult: Secondary | ICD-10-CM | POA: Diagnosis not present

## 2019-11-25 DIAGNOSIS — Z1331 Encounter for screening for depression: Secondary | ICD-10-CM | POA: Diagnosis not present

## 2019-11-25 DIAGNOSIS — E039 Hypothyroidism, unspecified: Secondary | ICD-10-CM | POA: Diagnosis not present

## 2019-11-25 DIAGNOSIS — I10 Essential (primary) hypertension: Secondary | ICD-10-CM | POA: Diagnosis not present

## 2019-11-25 DIAGNOSIS — E663 Overweight: Secondary | ICD-10-CM | POA: Diagnosis not present

## 2019-12-07 DIAGNOSIS — E538 Deficiency of other specified B group vitamins: Secondary | ICD-10-CM | POA: Diagnosis not present

## 2019-12-09 DIAGNOSIS — K5792 Diverticulitis of intestine, part unspecified, without perforation or abscess without bleeding: Secondary | ICD-10-CM | POA: Diagnosis not present

## 2019-12-09 DIAGNOSIS — Z6827 Body mass index (BMI) 27.0-27.9, adult: Secondary | ICD-10-CM | POA: Diagnosis not present

## 2020-01-09 DIAGNOSIS — D519 Vitamin B12 deficiency anemia, unspecified: Secondary | ICD-10-CM | POA: Diagnosis not present

## 2020-01-10 DIAGNOSIS — E039 Hypothyroidism, unspecified: Secondary | ICD-10-CM | POA: Diagnosis not present

## 2020-01-24 DIAGNOSIS — Z1211 Encounter for screening for malignant neoplasm of colon: Secondary | ICD-10-CM | POA: Diagnosis not present

## 2020-01-24 DIAGNOSIS — Z01818 Encounter for other preprocedural examination: Secondary | ICD-10-CM | POA: Diagnosis not present

## 2020-01-24 DIAGNOSIS — Z9229 Personal history of other drug therapy: Secondary | ICD-10-CM | POA: Diagnosis not present

## 2020-02-14 DIAGNOSIS — D519 Vitamin B12 deficiency anemia, unspecified: Secondary | ICD-10-CM | POA: Diagnosis not present

## 2020-02-28 DIAGNOSIS — H18413 Arcus senilis, bilateral: Secondary | ICD-10-CM | POA: Diagnosis not present

## 2020-02-28 DIAGNOSIS — H25013 Cortical age-related cataract, bilateral: Secondary | ICD-10-CM | POA: Diagnosis not present

## 2020-02-28 DIAGNOSIS — H2513 Age-related nuclear cataract, bilateral: Secondary | ICD-10-CM | POA: Diagnosis not present

## 2020-02-28 DIAGNOSIS — H25043 Posterior subcapsular polar age-related cataract, bilateral: Secondary | ICD-10-CM | POA: Diagnosis not present

## 2020-02-28 DIAGNOSIS — H2511 Age-related nuclear cataract, right eye: Secondary | ICD-10-CM | POA: Diagnosis not present

## 2020-03-13 DIAGNOSIS — E538 Deficiency of other specified B group vitamins: Secondary | ICD-10-CM | POA: Diagnosis not present

## 2020-03-21 DIAGNOSIS — Z79899 Other long term (current) drug therapy: Secondary | ICD-10-CM | POA: Diagnosis not present

## 2020-03-21 DIAGNOSIS — Z01818 Encounter for other preprocedural examination: Secondary | ICD-10-CM | POA: Diagnosis not present

## 2020-03-21 DIAGNOSIS — Z1211 Encounter for screening for malignant neoplasm of colon: Secondary | ICD-10-CM | POA: Diagnosis not present

## 2020-03-23 DIAGNOSIS — H2512 Age-related nuclear cataract, left eye: Secondary | ICD-10-CM | POA: Diagnosis not present

## 2020-03-23 DIAGNOSIS — H2511 Age-related nuclear cataract, right eye: Secondary | ICD-10-CM | POA: Diagnosis not present

## 2020-04-04 DIAGNOSIS — I10 Essential (primary) hypertension: Secondary | ICD-10-CM | POA: Diagnosis not present

## 2020-04-04 DIAGNOSIS — Z2821 Immunization not carried out because of patient refusal: Secondary | ICD-10-CM | POA: Diagnosis not present

## 2020-04-04 DIAGNOSIS — D6859 Other primary thrombophilia: Secondary | ICD-10-CM | POA: Diagnosis not present

## 2020-04-04 DIAGNOSIS — E663 Overweight: Secondary | ICD-10-CM | POA: Diagnosis not present

## 2020-04-04 DIAGNOSIS — I160 Hypertensive urgency: Secondary | ICD-10-CM | POA: Diagnosis not present

## 2020-04-04 DIAGNOSIS — Z86718 Personal history of other venous thrombosis and embolism: Secondary | ICD-10-CM | POA: Diagnosis not present

## 2020-04-04 DIAGNOSIS — D68318 Other hemorrhagic disorder due to intrinsic circulating anticoagulants, antibodies, or inhibitors: Secondary | ICD-10-CM | POA: Diagnosis not present

## 2020-04-04 DIAGNOSIS — R0989 Other specified symptoms and signs involving the circulatory and respiratory systems: Secondary | ICD-10-CM | POA: Diagnosis not present

## 2020-04-04 DIAGNOSIS — Z6826 Body mass index (BMI) 26.0-26.9, adult: Secondary | ICD-10-CM | POA: Diagnosis not present

## 2020-04-05 DIAGNOSIS — I10 Essential (primary) hypertension: Secondary | ICD-10-CM | POA: Diagnosis not present

## 2020-04-09 DIAGNOSIS — H2512 Age-related nuclear cataract, left eye: Secondary | ICD-10-CM | POA: Diagnosis not present

## 2020-04-12 DIAGNOSIS — R0989 Other specified symptoms and signs involving the circulatory and respiratory systems: Secondary | ICD-10-CM | POA: Diagnosis not present

## 2020-04-12 DIAGNOSIS — Z6827 Body mass index (BMI) 27.0-27.9, adult: Secondary | ICD-10-CM | POA: Diagnosis not present

## 2020-04-12 DIAGNOSIS — E663 Overweight: Secondary | ICD-10-CM | POA: Diagnosis not present

## 2020-04-12 DIAGNOSIS — I10 Essential (primary) hypertension: Secondary | ICD-10-CM | POA: Diagnosis not present

## 2020-04-12 DIAGNOSIS — I160 Hypertensive urgency: Secondary | ICD-10-CM | POA: Diagnosis not present

## 2020-04-12 DIAGNOSIS — D510 Vitamin B12 deficiency anemia due to intrinsic factor deficiency: Secondary | ICD-10-CM | POA: Diagnosis not present

## 2020-04-16 DIAGNOSIS — R0989 Other specified symptoms and signs involving the circulatory and respiratory systems: Secondary | ICD-10-CM | POA: Diagnosis not present

## 2020-04-16 DIAGNOSIS — I160 Hypertensive urgency: Secondary | ICD-10-CM | POA: Diagnosis not present

## 2020-04-16 DIAGNOSIS — I10 Essential (primary) hypertension: Secondary | ICD-10-CM | POA: Diagnosis not present

## 2020-04-19 DIAGNOSIS — Z1211 Encounter for screening for malignant neoplasm of colon: Secondary | ICD-10-CM | POA: Diagnosis not present

## 2020-04-19 DIAGNOSIS — Z01818 Encounter for other preprocedural examination: Secondary | ICD-10-CM | POA: Diagnosis not present

## 2020-04-19 DIAGNOSIS — K635 Polyp of colon: Secondary | ICD-10-CM | POA: Diagnosis not present

## 2020-04-25 DIAGNOSIS — K635 Polyp of colon: Secondary | ICD-10-CM | POA: Diagnosis not present

## 2020-05-08 DIAGNOSIS — D510 Vitamin B12 deficiency anemia due to intrinsic factor deficiency: Secondary | ICD-10-CM | POA: Diagnosis not present

## 2020-05-08 DIAGNOSIS — E039 Hypothyroidism, unspecified: Secondary | ICD-10-CM | POA: Diagnosis not present

## 2020-05-08 DIAGNOSIS — I10 Essential (primary) hypertension: Secondary | ICD-10-CM | POA: Diagnosis not present

## 2020-05-08 DIAGNOSIS — Z6827 Body mass index (BMI) 27.0-27.9, adult: Secondary | ICD-10-CM | POA: Diagnosis not present

## 2020-05-30 DIAGNOSIS — D126 Benign neoplasm of colon, unspecified: Secondary | ICD-10-CM | POA: Diagnosis not present

## 2020-05-30 DIAGNOSIS — K5730 Diverticulosis of large intestine without perforation or abscess without bleeding: Secondary | ICD-10-CM | POA: Diagnosis not present

## 2020-05-30 DIAGNOSIS — K648 Other hemorrhoids: Secondary | ICD-10-CM | POA: Diagnosis not present

## 2020-06-06 DIAGNOSIS — D510 Vitamin B12 deficiency anemia due to intrinsic factor deficiency: Secondary | ICD-10-CM | POA: Diagnosis not present

## 2020-07-05 DIAGNOSIS — Z23 Encounter for immunization: Secondary | ICD-10-CM | POA: Diagnosis not present

## 2020-07-05 DIAGNOSIS — D510 Vitamin B12 deficiency anemia due to intrinsic factor deficiency: Secondary | ICD-10-CM | POA: Diagnosis not present

## 2020-07-05 DIAGNOSIS — S50819A Abrasion of unspecified forearm, initial encounter: Secondary | ICD-10-CM | POA: Diagnosis not present

## 2020-07-12 DIAGNOSIS — E039 Hypothyroidism, unspecified: Secondary | ICD-10-CM | POA: Diagnosis not present

## 2020-07-12 DIAGNOSIS — I10 Essential (primary) hypertension: Secondary | ICD-10-CM | POA: Diagnosis not present

## 2020-08-20 DIAGNOSIS — J40 Bronchitis, not specified as acute or chronic: Secondary | ICD-10-CM | POA: Diagnosis not present

## 2020-08-20 DIAGNOSIS — R011 Cardiac murmur, unspecified: Secondary | ICD-10-CM | POA: Diagnosis not present

## 2020-08-20 DIAGNOSIS — J329 Chronic sinusitis, unspecified: Secondary | ICD-10-CM | POA: Diagnosis not present

## 2020-08-23 DIAGNOSIS — L570 Actinic keratosis: Secondary | ICD-10-CM | POA: Diagnosis not present

## 2020-08-23 DIAGNOSIS — L814 Other melanin hyperpigmentation: Secondary | ICD-10-CM | POA: Diagnosis not present

## 2020-08-23 DIAGNOSIS — D225 Melanocytic nevi of trunk: Secondary | ICD-10-CM | POA: Diagnosis not present

## 2020-08-23 DIAGNOSIS — L578 Other skin changes due to chronic exposure to nonionizing radiation: Secondary | ICD-10-CM | POA: Diagnosis not present

## 2020-09-11 DIAGNOSIS — D510 Vitamin B12 deficiency anemia due to intrinsic factor deficiency: Secondary | ICD-10-CM | POA: Diagnosis not present

## 2021-01-07 DIAGNOSIS — Z6828 Body mass index (BMI) 28.0-28.9, adult: Secondary | ICD-10-CM | POA: Diagnosis not present

## 2021-01-07 DIAGNOSIS — M8588 Other specified disorders of bone density and structure, other site: Secondary | ICD-10-CM | POA: Diagnosis not present

## 2021-01-07 DIAGNOSIS — Z01419 Encounter for gynecological examination (general) (routine) without abnormal findings: Secondary | ICD-10-CM | POA: Diagnosis not present

## 2021-01-07 DIAGNOSIS — N958 Other specified menopausal and perimenopausal disorders: Secondary | ICD-10-CM | POA: Diagnosis not present

## 2021-01-07 DIAGNOSIS — Z1231 Encounter for screening mammogram for malignant neoplasm of breast: Secondary | ICD-10-CM | POA: Diagnosis not present

## 2021-01-29 DIAGNOSIS — I10 Essential (primary) hypertension: Secondary | ICD-10-CM | POA: Diagnosis not present

## 2021-01-29 DIAGNOSIS — E039 Hypothyroidism, unspecified: Secondary | ICD-10-CM | POA: Diagnosis not present

## 2021-02-20 DIAGNOSIS — D519 Vitamin B12 deficiency anemia, unspecified: Secondary | ICD-10-CM | POA: Diagnosis not present

## 2021-03-06 DIAGNOSIS — M9905 Segmental and somatic dysfunction of pelvic region: Secondary | ICD-10-CM | POA: Diagnosis not present

## 2021-03-06 DIAGNOSIS — M9902 Segmental and somatic dysfunction of thoracic region: Secondary | ICD-10-CM | POA: Diagnosis not present

## 2021-03-06 DIAGNOSIS — M9903 Segmental and somatic dysfunction of lumbar region: Secondary | ICD-10-CM | POA: Diagnosis not present

## 2021-03-06 DIAGNOSIS — M5451 Vertebrogenic low back pain: Secondary | ICD-10-CM | POA: Diagnosis not present

## 2021-03-08 DIAGNOSIS — M9905 Segmental and somatic dysfunction of pelvic region: Secondary | ICD-10-CM | POA: Diagnosis not present

## 2021-03-08 DIAGNOSIS — M9903 Segmental and somatic dysfunction of lumbar region: Secondary | ICD-10-CM | POA: Diagnosis not present

## 2021-03-08 DIAGNOSIS — M5451 Vertebrogenic low back pain: Secondary | ICD-10-CM | POA: Diagnosis not present

## 2021-03-08 DIAGNOSIS — M9902 Segmental and somatic dysfunction of thoracic region: Secondary | ICD-10-CM | POA: Diagnosis not present

## 2021-03-11 DIAGNOSIS — M9905 Segmental and somatic dysfunction of pelvic region: Secondary | ICD-10-CM | POA: Diagnosis not present

## 2021-03-11 DIAGNOSIS — M9903 Segmental and somatic dysfunction of lumbar region: Secondary | ICD-10-CM | POA: Diagnosis not present

## 2021-03-11 DIAGNOSIS — M5451 Vertebrogenic low back pain: Secondary | ICD-10-CM | POA: Diagnosis not present

## 2021-03-11 DIAGNOSIS — M9902 Segmental and somatic dysfunction of thoracic region: Secondary | ICD-10-CM | POA: Diagnosis not present

## 2021-03-18 DIAGNOSIS — M9902 Segmental and somatic dysfunction of thoracic region: Secondary | ICD-10-CM | POA: Diagnosis not present

## 2021-03-18 DIAGNOSIS — M9905 Segmental and somatic dysfunction of pelvic region: Secondary | ICD-10-CM | POA: Diagnosis not present

## 2021-03-18 DIAGNOSIS — M9903 Segmental and somatic dysfunction of lumbar region: Secondary | ICD-10-CM | POA: Diagnosis not present

## 2021-03-18 DIAGNOSIS — M5451 Vertebrogenic low back pain: Secondary | ICD-10-CM | POA: Diagnosis not present

## 2021-04-22 DIAGNOSIS — D510 Vitamin B12 deficiency anemia due to intrinsic factor deficiency: Secondary | ICD-10-CM | POA: Diagnosis not present

## 2021-05-03 DIAGNOSIS — D6859 Other primary thrombophilia: Secondary | ICD-10-CM | POA: Diagnosis not present

## 2021-05-03 DIAGNOSIS — M47816 Spondylosis without myelopathy or radiculopathy, lumbar region: Secondary | ICD-10-CM | POA: Diagnosis not present

## 2021-05-03 DIAGNOSIS — Z13220 Encounter for screening for lipoid disorders: Secondary | ICD-10-CM | POA: Diagnosis not present

## 2021-05-03 DIAGNOSIS — R7301 Impaired fasting glucose: Secondary | ICD-10-CM | POA: Diagnosis not present

## 2021-05-03 DIAGNOSIS — Z86718 Personal history of other venous thrombosis and embolism: Secondary | ICD-10-CM | POA: Diagnosis not present

## 2021-05-03 DIAGNOSIS — E039 Hypothyroidism, unspecified: Secondary | ICD-10-CM | POA: Diagnosis not present

## 2021-05-03 DIAGNOSIS — Z79899 Other long term (current) drug therapy: Secondary | ICD-10-CM | POA: Diagnosis not present

## 2021-05-03 DIAGNOSIS — M19032 Primary osteoarthritis, left wrist: Secondary | ICD-10-CM | POA: Diagnosis not present

## 2021-05-03 DIAGNOSIS — D68318 Other hemorrhagic disorder due to intrinsic circulating anticoagulants, antibodies, or inhibitors: Secondary | ICD-10-CM | POA: Diagnosis not present

## 2021-05-03 DIAGNOSIS — Z0000 Encounter for general adult medical examination without abnormal findings: Secondary | ICD-10-CM | POA: Diagnosis not present

## 2021-05-03 DIAGNOSIS — M19031 Primary osteoarthritis, right wrist: Secondary | ICD-10-CM | POA: Diagnosis not present

## 2021-05-03 DIAGNOSIS — I10 Essential (primary) hypertension: Secondary | ICD-10-CM | POA: Diagnosis not present

## 2021-06-27 DIAGNOSIS — D519 Vitamin B12 deficiency anemia, unspecified: Secondary | ICD-10-CM | POA: Diagnosis not present

## 2021-07-10 DIAGNOSIS — M79645 Pain in left finger(s): Secondary | ICD-10-CM | POA: Diagnosis not present

## 2021-07-10 DIAGNOSIS — M79644 Pain in right finger(s): Secondary | ICD-10-CM | POA: Diagnosis not present

## 2021-07-10 DIAGNOSIS — M19242 Secondary osteoarthritis, left hand: Secondary | ICD-10-CM | POA: Diagnosis not present

## 2021-07-10 DIAGNOSIS — M19241 Secondary osteoarthritis, right hand: Secondary | ICD-10-CM | POA: Diagnosis not present

## 2021-07-11 DIAGNOSIS — M19241 Secondary osteoarthritis, right hand: Secondary | ICD-10-CM | POA: Diagnosis not present

## 2021-07-11 DIAGNOSIS — M79644 Pain in right finger(s): Secondary | ICD-10-CM | POA: Diagnosis not present

## 2021-07-11 DIAGNOSIS — M79645 Pain in left finger(s): Secondary | ICD-10-CM | POA: Diagnosis not present

## 2021-07-11 DIAGNOSIS — M19242 Secondary osteoarthritis, left hand: Secondary | ICD-10-CM | POA: Diagnosis not present

## 2021-07-18 DIAGNOSIS — M79645 Pain in left finger(s): Secondary | ICD-10-CM | POA: Diagnosis not present

## 2021-07-18 DIAGNOSIS — M79644 Pain in right finger(s): Secondary | ICD-10-CM | POA: Diagnosis not present

## 2021-07-18 DIAGNOSIS — M19241 Secondary osteoarthritis, right hand: Secondary | ICD-10-CM | POA: Diagnosis not present

## 2021-07-18 DIAGNOSIS — M19242 Secondary osteoarthritis, left hand: Secondary | ICD-10-CM | POA: Diagnosis not present

## 2021-07-19 DIAGNOSIS — M79644 Pain in right finger(s): Secondary | ICD-10-CM | POA: Diagnosis not present

## 2021-07-19 DIAGNOSIS — M19241 Secondary osteoarthritis, right hand: Secondary | ICD-10-CM | POA: Diagnosis not present

## 2021-07-19 DIAGNOSIS — M19242 Secondary osteoarthritis, left hand: Secondary | ICD-10-CM | POA: Diagnosis not present

## 2021-07-19 DIAGNOSIS — M79645 Pain in left finger(s): Secondary | ICD-10-CM | POA: Diagnosis not present

## 2021-07-19 DIAGNOSIS — D519 Vitamin B12 deficiency anemia, unspecified: Secondary | ICD-10-CM | POA: Diagnosis not present

## 2021-08-01 DIAGNOSIS — M79644 Pain in right finger(s): Secondary | ICD-10-CM | POA: Diagnosis not present

## 2021-08-01 DIAGNOSIS — M19242 Secondary osteoarthritis, left hand: Secondary | ICD-10-CM | POA: Diagnosis not present

## 2021-08-01 DIAGNOSIS — M79645 Pain in left finger(s): Secondary | ICD-10-CM | POA: Diagnosis not present

## 2021-08-01 DIAGNOSIS — M19241 Secondary osteoarthritis, right hand: Secondary | ICD-10-CM | POA: Diagnosis not present

## 2021-08-02 DIAGNOSIS — M79644 Pain in right finger(s): Secondary | ICD-10-CM | POA: Diagnosis not present

## 2021-08-02 DIAGNOSIS — M19242 Secondary osteoarthritis, left hand: Secondary | ICD-10-CM | POA: Diagnosis not present

## 2021-08-02 DIAGNOSIS — M19241 Secondary osteoarthritis, right hand: Secondary | ICD-10-CM | POA: Diagnosis not present

## 2021-08-02 DIAGNOSIS — M79645 Pain in left finger(s): Secondary | ICD-10-CM | POA: Diagnosis not present

## 2021-08-06 DIAGNOSIS — I10 Essential (primary) hypertension: Secondary | ICD-10-CM | POA: Diagnosis not present

## 2021-08-06 DIAGNOSIS — E039 Hypothyroidism, unspecified: Secondary | ICD-10-CM | POA: Diagnosis not present

## 2021-08-08 DIAGNOSIS — M79645 Pain in left finger(s): Secondary | ICD-10-CM | POA: Diagnosis not present

## 2021-08-08 DIAGNOSIS — M79644 Pain in right finger(s): Secondary | ICD-10-CM | POA: Diagnosis not present

## 2021-08-08 DIAGNOSIS — M19241 Secondary osteoarthritis, right hand: Secondary | ICD-10-CM | POA: Diagnosis not present

## 2021-08-08 DIAGNOSIS — M19242 Secondary osteoarthritis, left hand: Secondary | ICD-10-CM | POA: Diagnosis not present

## 2021-08-09 DIAGNOSIS — M19241 Secondary osteoarthritis, right hand: Secondary | ICD-10-CM | POA: Diagnosis not present

## 2021-08-09 DIAGNOSIS — M19242 Secondary osteoarthritis, left hand: Secondary | ICD-10-CM | POA: Diagnosis not present

## 2021-08-09 DIAGNOSIS — M79644 Pain in right finger(s): Secondary | ICD-10-CM | POA: Diagnosis not present

## 2021-08-09 DIAGNOSIS — M79645 Pain in left finger(s): Secondary | ICD-10-CM | POA: Diagnosis not present

## 2021-08-13 DIAGNOSIS — M19242 Secondary osteoarthritis, left hand: Secondary | ICD-10-CM | POA: Diagnosis not present

## 2021-08-13 DIAGNOSIS — M19241 Secondary osteoarthritis, right hand: Secondary | ICD-10-CM | POA: Diagnosis not present

## 2021-08-13 DIAGNOSIS — M79644 Pain in right finger(s): Secondary | ICD-10-CM | POA: Diagnosis not present

## 2021-08-13 DIAGNOSIS — M79645 Pain in left finger(s): Secondary | ICD-10-CM | POA: Diagnosis not present

## 2021-08-15 DIAGNOSIS — M19242 Secondary osteoarthritis, left hand: Secondary | ICD-10-CM | POA: Diagnosis not present

## 2021-08-15 DIAGNOSIS — M79645 Pain in left finger(s): Secondary | ICD-10-CM | POA: Diagnosis not present

## 2021-08-15 DIAGNOSIS — M79644 Pain in right finger(s): Secondary | ICD-10-CM | POA: Diagnosis not present

## 2021-08-15 DIAGNOSIS — M19241 Secondary osteoarthritis, right hand: Secondary | ICD-10-CM | POA: Diagnosis not present

## 2021-08-21 DIAGNOSIS — D519 Vitamin B12 deficiency anemia, unspecified: Secondary | ICD-10-CM | POA: Diagnosis not present

## 2021-08-21 DIAGNOSIS — M19242 Secondary osteoarthritis, left hand: Secondary | ICD-10-CM | POA: Diagnosis not present

## 2021-08-21 DIAGNOSIS — M79644 Pain in right finger(s): Secondary | ICD-10-CM | POA: Diagnosis not present

## 2021-08-21 DIAGNOSIS — M79645 Pain in left finger(s): Secondary | ICD-10-CM | POA: Diagnosis not present

## 2021-08-21 DIAGNOSIS — M19241 Secondary osteoarthritis, right hand: Secondary | ICD-10-CM | POA: Diagnosis not present

## 2021-08-28 DIAGNOSIS — L578 Other skin changes due to chronic exposure to nonionizing radiation: Secondary | ICD-10-CM | POA: Diagnosis not present

## 2021-08-28 DIAGNOSIS — L739 Follicular disorder, unspecified: Secondary | ICD-10-CM | POA: Diagnosis not present

## 2021-08-28 DIAGNOSIS — D485 Neoplasm of uncertain behavior of skin: Secondary | ICD-10-CM | POA: Diagnosis not present

## 2021-08-28 DIAGNOSIS — L814 Other melanin hyperpigmentation: Secondary | ICD-10-CM | POA: Diagnosis not present

## 2021-08-28 DIAGNOSIS — L570 Actinic keratosis: Secondary | ICD-10-CM | POA: Diagnosis not present

## 2021-08-28 DIAGNOSIS — D225 Melanocytic nevi of trunk: Secondary | ICD-10-CM | POA: Diagnosis not present

## 2021-10-04 DIAGNOSIS — D510 Vitamin B12 deficiency anemia due to intrinsic factor deficiency: Secondary | ICD-10-CM | POA: Diagnosis not present

## 2021-11-11 DIAGNOSIS — D519 Vitamin B12 deficiency anemia, unspecified: Secondary | ICD-10-CM | POA: Diagnosis not present

## 2021-11-22 DIAGNOSIS — L910 Hypertrophic scar: Secondary | ICD-10-CM | POA: Diagnosis not present

## 2021-11-22 DIAGNOSIS — C44319 Basal cell carcinoma of skin of other parts of face: Secondary | ICD-10-CM | POA: Diagnosis not present

## 2021-12-10 DIAGNOSIS — C44329 Squamous cell carcinoma of skin of other parts of face: Secondary | ICD-10-CM | POA: Diagnosis not present

## 2021-12-17 DIAGNOSIS — D519 Vitamin B12 deficiency anemia, unspecified: Secondary | ICD-10-CM | POA: Diagnosis not present

## 2022-02-04 DIAGNOSIS — I10 Essential (primary) hypertension: Secondary | ICD-10-CM | POA: Diagnosis not present

## 2022-02-04 DIAGNOSIS — E039 Hypothyroidism, unspecified: Secondary | ICD-10-CM | POA: Diagnosis not present

## 2022-02-18 DIAGNOSIS — E538 Deficiency of other specified B group vitamins: Secondary | ICD-10-CM | POA: Diagnosis not present

## 2022-03-20 DIAGNOSIS — L814 Other melanin hyperpigmentation: Secondary | ICD-10-CM | POA: Diagnosis not present

## 2022-03-20 DIAGNOSIS — C44329 Squamous cell carcinoma of skin of other parts of face: Secondary | ICD-10-CM | POA: Diagnosis not present

## 2022-03-20 DIAGNOSIS — L578 Other skin changes due to chronic exposure to nonionizing radiation: Secondary | ICD-10-CM | POA: Diagnosis not present

## 2022-03-26 DIAGNOSIS — E538 Deficiency of other specified B group vitamins: Secondary | ICD-10-CM | POA: Diagnosis not present

## 2022-03-31 DIAGNOSIS — Z1231 Encounter for screening mammogram for malignant neoplasm of breast: Secondary | ICD-10-CM | POA: Diagnosis not present

## 2022-03-31 DIAGNOSIS — Z6827 Body mass index (BMI) 27.0-27.9, adult: Secondary | ICD-10-CM | POA: Diagnosis not present

## 2022-03-31 DIAGNOSIS — Z01419 Encounter for gynecological examination (general) (routine) without abnormal findings: Secondary | ICD-10-CM | POA: Diagnosis not present

## 2022-05-26 DIAGNOSIS — E538 Deficiency of other specified B group vitamins: Secondary | ICD-10-CM | POA: Diagnosis not present

## 2022-07-15 DIAGNOSIS — Z9181 History of falling: Secondary | ICD-10-CM | POA: Diagnosis not present

## 2022-07-15 DIAGNOSIS — R7301 Impaired fasting glucose: Secondary | ICD-10-CM | POA: Diagnosis not present

## 2022-07-15 DIAGNOSIS — Z79899 Other long term (current) drug therapy: Secondary | ICD-10-CM | POA: Diagnosis not present

## 2022-07-15 DIAGNOSIS — E039 Hypothyroidism, unspecified: Secondary | ICD-10-CM | POA: Diagnosis not present

## 2022-07-15 DIAGNOSIS — D6859 Other primary thrombophilia: Secondary | ICD-10-CM | POA: Diagnosis not present

## 2022-07-15 DIAGNOSIS — Z0000 Encounter for general adult medical examination without abnormal findings: Secondary | ICD-10-CM | POA: Diagnosis not present

## 2022-07-15 DIAGNOSIS — E785 Hyperlipidemia, unspecified: Secondary | ICD-10-CM | POA: Diagnosis not present

## 2022-07-15 DIAGNOSIS — Z1331 Encounter for screening for depression: Secondary | ICD-10-CM | POA: Diagnosis not present

## 2022-07-15 DIAGNOSIS — Z6827 Body mass index (BMI) 27.0-27.9, adult: Secondary | ICD-10-CM | POA: Diagnosis not present

## 2022-07-15 DIAGNOSIS — D68318 Other hemorrhagic disorder due to intrinsic circulating anticoagulants, antibodies, or inhibitors: Secondary | ICD-10-CM | POA: Diagnosis not present

## 2022-07-15 DIAGNOSIS — E663 Overweight: Secondary | ICD-10-CM | POA: Diagnosis not present

## 2022-08-01 DIAGNOSIS — I83893 Varicose veins of bilateral lower extremities with other complications: Secondary | ICD-10-CM | POA: Diagnosis not present

## 2022-08-11 DIAGNOSIS — B974 Respiratory syncytial virus as the cause of diseases classified elsewhere: Secondary | ICD-10-CM | POA: Diagnosis not present

## 2022-08-11 DIAGNOSIS — R059 Cough, unspecified: Secondary | ICD-10-CM | POA: Diagnosis not present

## 2022-08-20 DIAGNOSIS — R509 Fever, unspecified: Secondary | ICD-10-CM | POA: Diagnosis not present

## 2022-08-20 DIAGNOSIS — M791 Myalgia, unspecified site: Secondary | ICD-10-CM | POA: Diagnosis not present

## 2022-08-20 DIAGNOSIS — R051 Acute cough: Secondary | ICD-10-CM | POA: Diagnosis not present

## 2022-08-27 DIAGNOSIS — D225 Melanocytic nevi of trunk: Secondary | ICD-10-CM | POA: Diagnosis not present

## 2022-08-27 DIAGNOSIS — L578 Other skin changes due to chronic exposure to nonionizing radiation: Secondary | ICD-10-CM | POA: Diagnosis not present

## 2022-08-27 DIAGNOSIS — L814 Other melanin hyperpigmentation: Secondary | ICD-10-CM | POA: Diagnosis not present

## 2022-08-27 DIAGNOSIS — L570 Actinic keratosis: Secondary | ICD-10-CM | POA: Diagnosis not present

## 2022-09-03 DIAGNOSIS — D519 Vitamin B12 deficiency anemia, unspecified: Secondary | ICD-10-CM | POA: Diagnosis not present

## 2022-10-08 DIAGNOSIS — L910 Hypertrophic scar: Secondary | ICD-10-CM | POA: Diagnosis not present

## 2022-10-08 DIAGNOSIS — L570 Actinic keratosis: Secondary | ICD-10-CM | POA: Diagnosis not present

## 2022-10-21 DIAGNOSIS — E538 Deficiency of other specified B group vitamins: Secondary | ICD-10-CM | POA: Diagnosis not present

## 2022-12-17 DIAGNOSIS — E538 Deficiency of other specified B group vitamins: Secondary | ICD-10-CM | POA: Diagnosis not present

## 2023-02-05 DIAGNOSIS — E039 Hypothyroidism, unspecified: Secondary | ICD-10-CM | POA: Diagnosis not present

## 2023-02-05 DIAGNOSIS — I10 Essential (primary) hypertension: Secondary | ICD-10-CM | POA: Diagnosis not present

## 2023-02-12 DIAGNOSIS — D519 Vitamin B12 deficiency anemia, unspecified: Secondary | ICD-10-CM | POA: Diagnosis not present

## 2023-03-17 DIAGNOSIS — D519 Vitamin B12 deficiency anemia, unspecified: Secondary | ICD-10-CM | POA: Diagnosis not present

## 2023-04-13 DIAGNOSIS — E538 Deficiency of other specified B group vitamins: Secondary | ICD-10-CM | POA: Diagnosis not present

## 2023-04-27 DIAGNOSIS — N958 Other specified menopausal and perimenopausal disorders: Secondary | ICD-10-CM | POA: Diagnosis not present

## 2023-04-27 DIAGNOSIS — Z6827 Body mass index (BMI) 27.0-27.9, adult: Secondary | ICD-10-CM | POA: Diagnosis not present

## 2023-04-27 DIAGNOSIS — Z01419 Encounter for gynecological examination (general) (routine) without abnormal findings: Secondary | ICD-10-CM | POA: Diagnosis not present

## 2023-04-27 DIAGNOSIS — M8588 Other specified disorders of bone density and structure, other site: Secondary | ICD-10-CM | POA: Diagnosis not present

## 2023-04-27 DIAGNOSIS — Z1231 Encounter for screening mammogram for malignant neoplasm of breast: Secondary | ICD-10-CM | POA: Diagnosis not present

## 2023-05-08 DIAGNOSIS — E538 Deficiency of other specified B group vitamins: Secondary | ICD-10-CM | POA: Diagnosis not present

## 2023-08-27 DIAGNOSIS — I1A0 Resistant hypertension: Secondary | ICD-10-CM | POA: Diagnosis not present

## 2023-08-27 DIAGNOSIS — D68318 Other hemorrhagic disorder due to intrinsic circulating anticoagulants, antibodies, or inhibitors: Secondary | ICD-10-CM | POA: Diagnosis not present

## 2023-08-27 DIAGNOSIS — R7301 Impaired fasting glucose: Secondary | ICD-10-CM | POA: Diagnosis not present

## 2023-08-27 DIAGNOSIS — Z79899 Other long term (current) drug therapy: Secondary | ICD-10-CM | POA: Diagnosis not present

## 2023-08-27 DIAGNOSIS — M19032 Primary osteoarthritis, left wrist: Secondary | ICD-10-CM | POA: Diagnosis not present

## 2023-08-27 DIAGNOSIS — E538 Deficiency of other specified B group vitamins: Secondary | ICD-10-CM | POA: Diagnosis not present

## 2023-08-27 DIAGNOSIS — E785 Hyperlipidemia, unspecified: Secondary | ICD-10-CM | POA: Diagnosis not present

## 2023-08-27 DIAGNOSIS — E039 Hypothyroidism, unspecified: Secondary | ICD-10-CM | POA: Diagnosis not present

## 2023-08-27 DIAGNOSIS — M19031 Primary osteoarthritis, right wrist: Secondary | ICD-10-CM | POA: Diagnosis not present

## 2023-08-27 DIAGNOSIS — Z86718 Personal history of other venous thrombosis and embolism: Secondary | ICD-10-CM | POA: Diagnosis not present

## 2023-08-27 DIAGNOSIS — D6859 Other primary thrombophilia: Secondary | ICD-10-CM | POA: Diagnosis not present

## 2023-08-27 DIAGNOSIS — Z0000 Encounter for general adult medical examination without abnormal findings: Secondary | ICD-10-CM | POA: Diagnosis not present

## 2023-09-10 DIAGNOSIS — L300 Nummular dermatitis: Secondary | ICD-10-CM | POA: Diagnosis not present

## 2023-09-10 DIAGNOSIS — D225 Melanocytic nevi of trunk: Secondary | ICD-10-CM | POA: Diagnosis not present

## 2023-09-10 DIAGNOSIS — L910 Hypertrophic scar: Secondary | ICD-10-CM | POA: Diagnosis not present

## 2023-09-10 DIAGNOSIS — L299 Pruritus, unspecified: Secondary | ICD-10-CM | POA: Diagnosis not present

## 2023-09-10 DIAGNOSIS — L821 Other seborrheic keratosis: Secondary | ICD-10-CM | POA: Diagnosis not present

## 2023-09-10 DIAGNOSIS — L570 Actinic keratosis: Secondary | ICD-10-CM | POA: Diagnosis not present

## 2023-09-30 DIAGNOSIS — D519 Vitamin B12 deficiency anemia, unspecified: Secondary | ICD-10-CM | POA: Diagnosis not present

## 2023-10-30 DIAGNOSIS — D519 Vitamin B12 deficiency anemia, unspecified: Secondary | ICD-10-CM | POA: Diagnosis not present

## 2023-12-30 DIAGNOSIS — E538 Deficiency of other specified B group vitamins: Secondary | ICD-10-CM | POA: Diagnosis not present

## 2024-02-08 DIAGNOSIS — E538 Deficiency of other specified B group vitamins: Secondary | ICD-10-CM | POA: Diagnosis not present

## 2024-02-09 DIAGNOSIS — I10 Essential (primary) hypertension: Secondary | ICD-10-CM | POA: Diagnosis not present

## 2024-02-09 DIAGNOSIS — E039 Hypothyroidism, unspecified: Secondary | ICD-10-CM | POA: Diagnosis not present

## 2024-03-21 DIAGNOSIS — E538 Deficiency of other specified B group vitamins: Secondary | ICD-10-CM | POA: Diagnosis not present

## 2024-04-20 DIAGNOSIS — E538 Deficiency of other specified B group vitamins: Secondary | ICD-10-CM | POA: Diagnosis not present

## 2024-05-24 DIAGNOSIS — E538 Deficiency of other specified B group vitamins: Secondary | ICD-10-CM | POA: Diagnosis not present

## 2024-07-12 ENCOUNTER — Other Ambulatory Visit: Payer: Self-pay | Admitting: Obstetrics and Gynecology

## 2024-07-12 DIAGNOSIS — R928 Other abnormal and inconclusive findings on diagnostic imaging of breast: Secondary | ICD-10-CM

## 2024-07-28 ENCOUNTER — Ambulatory Visit
Admission: RE | Admit: 2024-07-28 | Discharge: 2024-07-28 | Disposition: A | Source: Ambulatory Visit | Attending: Obstetrics and Gynecology | Admitting: Obstetrics and Gynecology

## 2024-07-28 DIAGNOSIS — R928 Other abnormal and inconclusive findings on diagnostic imaging of breast: Secondary | ICD-10-CM
